# Patient Record
Sex: Male | Born: 1975
Health system: Southern US, Community
[De-identification: ages and names within clinical notes are randomized; demographics above are authoritative.]

## PROBLEM LIST (undated history)

## (undated) DIAGNOSIS — B349 Viral infection, unspecified: Secondary | ICD-10-CM

## (undated) DIAGNOSIS — A048 Other specified bacterial intestinal infections: Secondary | ICD-10-CM

## (undated) DIAGNOSIS — K219 Gastro-esophageal reflux disease without esophagitis: Secondary | ICD-10-CM

## (undated) DIAGNOSIS — R072 Precordial pain: Secondary | ICD-10-CM

## (undated) DIAGNOSIS — I77810 Thoracic aortic ectasia: Secondary | ICD-10-CM

## (undated) DIAGNOSIS — R079 Chest pain, unspecified: Secondary | ICD-10-CM

## (undated) DIAGNOSIS — I10 Essential (primary) hypertension: Secondary | ICD-10-CM

## (undated) DIAGNOSIS — L0291 Cutaneous abscess, unspecified: Secondary | ICD-10-CM

## (undated) DIAGNOSIS — Q2112 Patent foramen ovale: Secondary | ICD-10-CM

## (undated) DIAGNOSIS — R109 Unspecified abdominal pain: Secondary | ICD-10-CM

## (undated) DIAGNOSIS — R1013 Epigastric pain: Secondary | ICD-10-CM

## (undated) HISTORY — DX: Unspecified abdominal pain: R10.9

## (undated) HISTORY — DX: Thoracic aortic ectasia: I77.810

## (undated) HISTORY — DX: Precordial pain: R07.2

## (undated) HISTORY — DX: Cutaneous abscess, unspecified: L02.91

## (undated) HISTORY — DX: Epigastric pain: R10.13

## (undated) HISTORY — DX: Viral infection, unspecified: B34.9

## (undated) HISTORY — DX: Patent foramen ovale: Q21.12

## (undated) HISTORY — DX: Chest pain, unspecified: R07.9

## (undated) HISTORY — DX: Essential (primary) hypertension: I10

## (undated) HISTORY — DX: Other specified bacterial intestinal infections: A04.8

## (undated) HISTORY — DX: Gastro-esophageal reflux disease without esophagitis: K21.9

---

## 2017-01-19 ENCOUNTER — Encounter (HOSPITAL_COMMUNITY): Payer: Self-pay | Admitting: Family Medicine

## 2017-01-19 ENCOUNTER — Ambulatory Visit (HOSPITAL_COMMUNITY)
Admission: EM | Admit: 2017-01-19 | Discharge: 2017-01-19 | Disposition: A | Discharge status: Home / Self Care | Payer: BLUE CROSS/BLUE SHIELD | Attending: Family Medicine | Admitting: Family Medicine

## 2017-01-19 DIAGNOSIS — J069 Acute upper respiratory infection, unspecified: Secondary | ICD-10-CM | POA: Diagnosis not present

## 2017-01-19 DIAGNOSIS — K219 Gastro-esophageal reflux disease without esophagitis: Secondary | ICD-10-CM

## 2017-01-19 MED ORDER — RANITIDINE HCL 300 MG PO TABS: 300.0000 mg | ORAL_TABLET | Freq: Every day | ORAL | 2 refills | Status: DC

## 2017-01-19 MED ORDER — IPRATROPIUM BROMIDE 0.06 % NA SOLN: 2.0000 | Freq: Four times a day (QID) | NASAL | 0 refills | Status: DC

## 2017-01-19 NOTE — ED Provider Notes (Signed)
  [facility]   [id number] 01/19/17 Arrival Time: 1722  ASSESSMENT & PLAN:  1. Viral URI   2. Gastroesophageal reflux disease, esophagitis presence not specified     Meds ordered this encounter  Medications  . ranitidine (ZANTAC) 300 MG tablet    Sig: Take 1 tablet (300 mg total) by mouth at bedtime.    Dispense:  30 tablet    Refill:  2    Order Specific Question:   Supervising Provider    Answer:   Mardella LaymanHAGLER, BRIAN [id number]]  . ipratropium (ATROVENT) 0.06 % nasal spray    Sig: Place 2 sprays into both nostrils 4 (four) times daily.    Dispense:  15 mL    Refill:  0    Order Specific Question:   Supervising Provider    Answer:   Mardella LaymanHAGLER, BRIAN [[id number]]    Reviewed expectations re: course of current medical issues. Questions answered. Outlined signs and symptoms indicating need for more acute intervention. Patient verbalized understanding. After Visit Summary given.   SUBJECTIVE:  Martin Wright is a 41 y.o. male who presents with complaint of Sinus pain and pressure, along with congestion that is been ongoing for 1 week, states initially had fever, this is gone, he denies any cough, no ear pain or pressure, no ringing in his ears, no chest pain or shortness of breath, does have some burning in his abdomen, was going on prior to this illness. No dysuria or frequency, no nausea, vomiting, or constipation, he works in a warehouse doing manual labor, does not drink or smoke, no history of asthma or lung disease.  ROS: As per HPI, remainder of ROS negative.   OBJECTIVE:  Vitals:   01/19/17 1739  BP: (!) 135/93  Pulse: 70  Resp: 18  Temp: 98 F (36.7 C)  SpO2: 100%     General appearance: alert; no distress HEENT: normocephalic; atraumatic; conjunctivae normal; No submental, submandibular, tonsillar, pre-or postauricular lymphadenopathy, oropharynx is clear without erythema or edema, there is no tonsillar exudate, no epistaxis, turbinates are boggy, no  sinus tenderness. Neck: No cervical lymphadenopathy Lungs: clear to auscultation bilaterally Heart: regular rate and rhythm Abdomen: soft, non-tender; bowel sounds normal; no masses or organomegaly; no guarding or rebound tenderness Back: no CVA tenderness Extremities: no cyanosis or edema; symmetrical with no gross deformities Skin: warm and dry Neurologic: Grossly normal Psychological:  alert and cooperative; normal mood and affect  Procedures:     No results found for this or any previous visit.  Labs Reviewed - No data to display  No results found.  No Known Allergies  PMHx, SurgHx, SocialHx, Medications, and Allergies were reviewed in the Visit Navigator and updated as appropriate.       Dorena BodoKennard, Marisella Puccio, NP 01/19/17 [phone]

## 2017-01-19 NOTE — Discharge Instructions (Signed)
You most likely have a viral URI, this type of infection will not be helped by antibiotics.  For your symptoms I have prescribed ipratropium for congestion and runny nose, 2 sprays each nostril up to 4 times a day, and for your reflux, Zantac, one tablet every night at bedtime.   In addition to these therapies, I advise rest, plenty of fluids and management of symptoms with over the counter medicines. Over the counter therapies for your symptoms include:Tylenol as needed every 4-6 hours for body aches or fever, not to exceed 4,000 mg a day, Take mucinex or mucinex DM ever 12 hours with a full glass of water, you may use an inhaled steroid such as Flonase, 2 sprays each nostril once a day for congestion, or an antihistamine such as Claritin or Zyrtec once a day. Another alternative for congestion, is a pseudoephedrine containing product available from the pharmacist. Should your symptoms worsen or fail to resolve, follow up with your primary care provider or return to clinic.

## 2017-01-19 NOTE — ED Triage Notes (Signed)
Pt here for cough, congestion, runny nose, body aches.

## 2017-04-16 ENCOUNTER — Ambulatory Visit (INDEPENDENT_AMBULATORY_CARE_PROVIDER_SITE_OTHER): Payer: BLUE CROSS/BLUE SHIELD

## 2017-04-16 ENCOUNTER — Encounter (HOSPITAL_COMMUNITY): Payer: Self-pay | Admitting: Emergency Medicine

## 2017-04-16 ENCOUNTER — Ambulatory Visit (HOSPITAL_COMMUNITY)
Admission: EM | Admit: 2017-04-16 | Discharge: 2017-04-16 | Disposition: A | Discharge status: Home / Self Care | Payer: BLUE CROSS/BLUE SHIELD | Attending: Internal Medicine | Admitting: Internal Medicine

## 2017-04-16 DIAGNOSIS — R1084 Generalized abdominal pain: Secondary | ICD-10-CM

## 2017-04-16 MED ORDER — OMEPRAZOLE 20 MG PO CPDR: 20.0000 mg | DELAYED_RELEASE_CAPSULE | Freq: Every day | ORAL | 0 refills | Status: DC

## 2017-04-16 NOTE — ED Provider Notes (Signed)
[facility]    CSN: [id number] Arrival date & time: 04/16/17  1658     History   Chief Complaint Chief Complaint  Patient presents with  . Abdominal Pain    HPI Martin Wright is a 41 y.o. male.   Patient does not have PCP.     The history is provided by the patient.  Abdominal Pain  Pain location:  Generalized Pain quality: burning   Pain radiation: occasionally radiates to the chest. Pain severity:  Unable to specify Onset quality:  Gradual Duration: Chronic; over 1 year. Timing:  Intermittent Progression:  Unchanged Chronicity:  Chronic Context: not alcohol use, not awakening from sleep, not diet changes, not recent travel, not sick contacts and not suspicious food intake   Relieved by:  Nothing Worsened by:  Nothing Ineffective treatments:  None tried Associated symptoms: constipation   Associated symptoms: no anorexia, no belching, no chest pain, no diarrhea, no dysuria, no nausea and no shortness of breath     History reviewed. No pertinent past medical history.  There are no active problems to display for this patient.   History reviewed. No pertinent surgical history.     Home Medications    Prior to Admission medications   Medication Sig Start Date End Date Taking? Authorizing Provider  ipratropium (ATROVENT) 0.06 % nasal spray Place 2 sprays into both nostrils 4 (four) times daily. 01/19/17   Dorena Bodo, NP  omeprazole (PRILOSEC) 20 MG capsule Take 1 capsule (20 mg total) daily by mouth. 04/16/17 05/16/17  Lucia Estelle, NP  ranitidine (ZANTAC) 300 MG tablet Take 1 tablet (300 mg total) by mouth at bedtime. 01/19/17   Dorena Bodo, NP    Family History History reviewed. No pertinent family history.  Social History Social History   Tobacco Use  . Smoking status: Not on file  Substance Use Topics  . Alcohol use: Not on file  . Drug use: Not on file     Allergies   Patient has no known allergies.   Review of  Systems Review of Systems  Constitutional:       See HPI  Respiratory: Negative for shortness of breath.   Cardiovascular: Negative for chest pain.  Gastrointestinal: Positive for abdominal pain and constipation. Negative for anorexia, diarrhea and nausea.  Genitourinary: Negative for dysuria.     Physical Exam Triage Vital Signs ED Triage Vitals [04/16/17 1717]  Enc Vitals Group     BP (!) 146/92     Pulse Rate (!) 59     Resp 18     Temp 98.3 F (36.8 C)     Temp Source Oral     SpO2 99 %     Weight      Height      Head Circumference      Peak Flow      Pain Score      Pain Loc      Pain Edu?      Excl. in GC?    No data found.  Updated Vital Signs BP (!) 146/92 (BP Location: Right Arm)   Pulse (!) 59   Temp 98.3 F (36.8 C) (Oral)   Resp 18   SpO2 99%   Visual Acuity Right Eye Distance:   Left Eye Distance:   Bilateral Distance:    Right Eye Near:   Left Eye Near:    Bilateral Near:     Physical Exam  Constitutional: He appears well-developed and well-nourished. No distress.  HENT:  Head: Normocephalic and atraumatic.  Cardiovascular: Normal rate and normal heart sounds.  No murmur heard. Pulmonary/Chest: Effort normal and breath sounds normal. He has no wheezes.  Abdominal: Soft. Normal appearance and bowel sounds are normal. He exhibits no distension and no mass. There is no tenderness.  Skin: Skin is warm and dry.  Nursing note and vitals reviewed.   UC Treatments / Results  Labs (all labs ordered are listed, but only abnormal results are displayed) Labs Reviewed - No data to display  EKG  EKG Interpretation None       Radiology Dg Abd 2 Views  Result Date: 04/16/2017 CLINICAL DATA:  Abdominal pain.  Symptoms for 2 days. EXAM: ABDOMEN - 2 VIEW COMPARISON:  None. FINDINGS: Nonobstructive gas pattern. Moderate stool burden. Curvilinear calcification, subcentimeter, RIGHT mid abdomen, which appears to move with the bowel, not  retroperitoneal in location. This could represent a small lymph node or even an appendicolith. No definite renal calculi. Osseous structures unremarkable. IMPRESSION: Nonobstructive gas pattern. Subcentimeter curvilinear calcification on the RIGHT of uncertain significance. Consider CT abdomen and pelvis with contrast for further evaluation. Electronically Signed   By: Elsie StainJohn T Curnes M.D.   On: 04/16/2017 19:54    Procedures Procedures (including critical care time)  Medications Ordered in UC Medications - No data to display   Initial Impression / Assessment and Plan / UC Course  I have reviewed the triage vital signs and the nursing notes.  Pertinent labs & imaging results that were available during my care of the patient were reviewed by me and considered in my medical decision making (see chart for details).   Final Clinical Impressions(s) / UC Diagnoses   Final diagnoses:  Generalized abdominal pain   Healthy 41 y.o male, present with chronic intermittent burning abdominal pain of unclear etiology. Abd xray shows subcentimeter curvilinear calcification on the right abdomen of uncertain significance. However doubt that this finding is the cause of his abdominal pain. Will trial course of omeprazole for his burning pain.    Patient informed of the xray result and instructed to establish care with a  PCP and f/u accordingly.   [facility] Also Instructed to have his BP recheck during follow up.    ED Discharge Orders        Ordered    omeprazole (PRILOSEC) 20 MG capsule  Daily     04/16/17 2008       Controlled Substance Prescriptions [place] Controlled Substance Registry consulted? Not Applicable   Lucia EstelleZheng, Othell Diluzio, NP 04/16/17 2016

## 2017-04-16 NOTE — ED Triage Notes (Signed)
Pt sts generalized abd pain into back x 2 days

## 2017-05-07 ENCOUNTER — Ambulatory Visit (INDEPENDENT_AMBULATORY_CARE_PROVIDER_SITE_OTHER): Payer: BLUE CROSS/BLUE SHIELD | Admitting: Physician Assistant

## 2017-05-07 ENCOUNTER — Encounter (INDEPENDENT_AMBULATORY_CARE_PROVIDER_SITE_OTHER): Payer: Self-pay | Admitting: Physician Assistant

## 2017-05-07 VITALS — BP 135/89 | HR 70 | Temp 98.1°F | Ht 64.5 in | Wt 120.4 lb

## 2017-05-07 DIAGNOSIS — R1013 Epigastric pain: Secondary | ICD-10-CM

## 2017-05-07 DIAGNOSIS — R109 Unspecified abdominal pain: Secondary | ICD-10-CM | POA: Diagnosis not present

## 2017-05-07 DIAGNOSIS — R933 Abnormal findings on diagnostic imaging of other parts of digestive tract: Secondary | ICD-10-CM

## 2017-05-07 DIAGNOSIS — R630 Anorexia: Secondary | ICD-10-CM | POA: Diagnosis not present

## 2017-05-07 DIAGNOSIS — R197 Diarrhea, unspecified: Secondary | ICD-10-CM

## 2017-05-07 DIAGNOSIS — R42 Dizziness and giddiness: Secondary | ICD-10-CM | POA: Diagnosis not present

## 2017-05-07 DIAGNOSIS — Z8619 Personal history of other infectious and parasitic diseases: Secondary | ICD-10-CM | POA: Diagnosis not present

## 2017-05-07 LAB — POCT URINALYSIS DIPSTICK
Glucose, UA: NEGATIVE
LEUKOCYTES UA: NEGATIVE
Nitrite, UA: NEGATIVE
Protein, UA: 30
SPEC GRAV UA: 1.02 (ref 1.010–1.025)
Urobilinogen, UA: 0.2 E.U./dL
pH, UA: 5.5 (ref 5.0–8.0)

## 2017-05-07 MED ORDER — MECLIZINE HCL 25 MG PO TABS: 25.0000 mg | ORAL_TABLET | Freq: Three times a day (TID) | ORAL | 0 refills | Status: DC | PRN

## 2017-05-07 MED ORDER — LOPERAMIDE HCL 2 MG PO TABS: 2.0000 mg | ORAL_TABLET | Freq: Four times a day (QID) | ORAL | 0 refills | Status: DC | PRN

## 2017-05-07 MED ORDER — OMEPRAZOLE 40 MG PO CPDR: 40.0000 mg | DELAYED_RELEASE_CAPSULE | Freq: Every day | ORAL | 3 refills | Status: DC

## 2017-05-07 NOTE — Progress Notes (Signed)
Pt states that three weeks ago he had a burning sensation in his stomach, this burning was present even while he was back [place], he would take antibiotics and the burning would go away. Pt was seen in Urgent Care and had an Xray done, he was told that they found something in his stomach but couldn't tell him what, he was told he needed further imagining to determine exactly what is in his stomach. Pt was advised to take omeprazole he bought it and began taking it, he was then told by someone to drink carnation milk to help with the burning, he did that. Now he states that he has had blurred vision and dizziness.   Pt states in 2012 he was diagnosed with Hep B  Pt states that he sometimes feels weakness in his legs and joints   Pt states he has a poor appetite   Pt states on Sunday he ate plantains and potato salad and has since been passing watery stool

## 2017-05-07 NOTE — Progress Notes (Signed)
Subjective:  Patient ID: Martin Wright, male    DOB: [birth year]  Age: 41 y.o. MRN: [id number]  CC:  Epigastric pain    HPI Martin Wright is a 41 y.o. male with no signifcant medical history presents with epigastric pain. Was preciously prescribed antibiotics and omeprazole which helped with "burning" sensation. Pain is also felt in the right and left flanks. Had recent imaging which incidentally revealed a "calcification" in the mid right abdomen but could not be differentiated. CT abdomen/pelvis with contrast advised. Has been having watery non bloody diarrhea since eating potato salad. Has a poor appetite but admittingly has had poor appetite for many years since being in [place]. He was diagnosed with hepatitis B in [place] in 2012. Complains of "dizziness in the eyes".          Outpatient Medications Prior to Visit  Medication Sig Dispense Refill  . omeprazole (PRILOSEC) 20 MG capsule Take 1 capsule (20 mg total) daily by mouth. 30 capsule 0  . ipratropium (ATROVENT) 0.06 % nasal spray Place 2 sprays into both nostrils 4 (four) times daily. (Patient not taking: Reported on 05/07/2017) 15 mL 0  . ranitidine (ZANTAC) 300 MG tablet Take 1 tablet (300 mg total) by mouth at bedtime. (Patient not taking: Reported on 05/07/2017) 30 tablet 2   No facility-administered medications prior to visit.      ROS Review of Systems  Constitutional: Negative for chills, fever and malaise/fatigue.  Eyes: Negative for blurred vision.  Respiratory: Negative for shortness of breath.   Cardiovascular: Negative for chest pain and palpitations.  Gastrointestinal: Negative for abdominal pain and nausea.  Genitourinary: Negative for dysuria and hematuria.  Musculoskeletal: Negative for joint pain and myalgias.  Skin: Negative for rash.  Neurological: Negative for tingling and headaches.  Psychiatric/Behavioral: Negative for depression. The patient is not nervous/anxious.     Objective:   BP 135/89 (BP Location: Left Arm, Patient Position: Sitting, Cuff Size: Normal)   Pulse 70   Temp 98.1 F (36.7 C) (Oral)   Ht 5' 4.5" (1.638 m)   Wt 120 lb 6.4 oz (54.6 kg)   SpO2 98%   BMI 20.35 kg/m   BP/Weight 05/07/2017 04/16/2017 01/19/2017  Systolic BP 135 146 135  Diastolic BP 89 92 93  Wt. (Lbs) 120.4 - -  BMI 20.35 - -      Physical Exam  Constitutional: He is oriented to person, place, and time.  Well developed, thin, NAD, polite  HENT:  Head: Normocephalic and atraumatic.  Eyes: No scleral icterus.  Neck: Normal range of motion. Neck supple. No thyromegaly present.  Cardiovascular: Normal rate, regular rhythm and normal heart sounds.  Pulmonary/Chest: Effort normal and breath sounds normal.  Abdominal: Soft. Bowel sounds are normal. He exhibits no distension and no mass. There is no tenderness. There is no rebound and no guarding.  No hepatomegaly  Musculoskeletal: He exhibits no edema.  Neurological: He is alert and oriented to person, place, and time. No cranial nerve deficit. Coordination normal.  Skin: Skin is warm and dry. No rash noted. No erythema. No pallor.  Psychiatric: He has a normal mood and affect. His behavior is normal. Thought content normal.  Vitals reviewed.    Assessment & Plan:    1. Abdominal pain, epigastric - H. pylori breath test - Begin omeprazole (PRILOSEC) 40 MG capsule; Take 1 capsule (40 mg total) by mouth daily.  Dispense: 30 capsule; Refill: 3  2. Bilateral flank pain - Urinalysis Dipstick with  trace ketones and trace blood in clinic today.  3. Diarrhea, unspecified type - Ova and Parasite Examination - Stool culture - CBC with Differential - Comprehensive metabolic panel  4. Anorexia - CBC with Differential - Comprehensive metabolic panel - TSH  5. History of hepatitis B virus infection - Hepatitis panel, acute  6. Abnormal findings on diagnostic imaging of other parts of digestive tract - CT Abdomen Pelvis W  Contrast; Future   Meds ordered this encounter  Medications  . loperamide (IMODIUM A-D) 2 MG tablet    Sig: Take 1 tablet (2 mg total) by mouth 4 (four) times daily as needed for diarrhea or loose stools.    Dispense:  30 tablet    Refill:  0    Order Specific Question:   Supervising Provider    Answer:   Quentin AngstJEGEDE, OLUGBEMIGA E [id number]  . omeprazole (PRILOSEC) 40 MG capsule    Sig: Take 1 capsule (40 mg total) by mouth daily.    Dispense:  30 capsule    Refill:  3    Order Specific Question:   Supervising Provider    Answer:   Quentin AngstJEGEDE, OLUGBEMIGA E [id number]    Follow-up: Return in about 4 weeks (around 06/04/2017) for abdominal discomfort.   Loletta Specteroger David Konner Warrior PA

## 2017-05-07 NOTE — Patient Instructions (Signed)
Hepatitis B Antigen and Antibody Tests Why am I having this test? Testing for hepatitis B virus (HBV) can include checking your blood for infection by the virus (antigen) itself or for hepatitis B antibodies. Hepatitis B antibodies are infection-fighting proteins produced by your body's defense (immune) system in response to an infection with HBV. These antibodies are also produced after you have received the hepatitis B vaccine. Different types of HBV antibodies may be present in your blood, depending on how long it has been since infection or vaccination occurred. Different types of HBV antigens can also be detected. The hepatitis B antigen and antibody tests detect the presence of these antigens or antibodies. These tests may be done:  If there is concern that you have been exposed to hepatitis B.  To diagnose hepatitis B infection.  If you have long-term (chronic) liver disease or abnormal liver function test results. This test may help to find the cause.  To see if you are already protected from (immune to) hepatitis B.  To see if you need the hepatitis B vaccine to protect you from future infection.  What kind of sample is taken? A blood sample is required for the hepatitis B antigen and antibody tests. It is usually collected by inserting a needle into a vein. How do I prepare for this test? There is no preparation required for this test. What do the results mean? It is your responsibility to obtain your test results. Ask the lab or department performing the test when and how you will get your results. Talk with your health care provider if you have any questions about your results. The test results are based on which antibodies your blood has (is positive for) and which antibodies your blood does not have (is negative for). The test results are also based on whether hepatitis B antigen molecules are found in your blood. Normal Test Results The test result is normal if it matches one  of these descriptions:  Negative HBsAg and HBeAg. This means no HBV antigen is present in your blood.  Positive HBsAb. This means you do not have an active infection but that you are protected from HBV from past exposure or from vaccination.  Abnormal Test Results The test result is abnormal and may indicate current or recent HBV infection if it matches one of these descriptions:  Positive HBeAg and HBsAg. This means you have a current HBV infection.  Positive HBVc-Ab/IgM or HBVc-Ab total. This means you have a current, previous, or long-term HBV infection.  Discuss your test results with your health care provider. He or she will use the results to make a diagnosis and determine a treatment plan that is right for you. Talk with your health care provider to discuss your results, treatment options, and if necessary, the need for more tests. Talk with your health care provider if you have any questions about your results. This information is not intended to replace advice given to you by your health care provider. Make sure you discuss any questions you have with your health care provider. Document Released: 06/22/2004 Document Revised: 02/01/2016 Document Reviewed: 10/13/2013 Elsevier Interactive Patient Education  2018 [employer]  

## 2017-05-08 LAB — COMPREHENSIVE METABOLIC PANEL
ALBUMIN: 5.3 g/dL (ref 3.5–5.5)
ALT: 23 IU/L (ref 0–44)
AST: 21 IU/L (ref 0–40)
Albumin/Globulin Ratio: 1.8 (ref 1.2–2.2)
Alkaline Phosphatase: 58 IU/L (ref 39–117)
BUN/Creatinine Ratio: 12 (ref 9–20)
BUN: 15 mg/dL (ref 6–24)
Bilirubin Total: 1.1 mg/dL (ref 0.0–1.2)
CO2: 23 mmol/L (ref 20–29)
Calcium: 10.3 mg/dL — ABNORMAL HIGH (ref 8.7–10.2)
Chloride: 103 mmol/L (ref 96–106)
Creatinine, Ser: 1.25 mg/dL (ref 0.76–1.27)
GFR calc Af Amer: 82 mL/min/{1.73_m2} (ref >59)
GFR calc non Af Amer: 71 mL/min/{1.73_m2} (ref >59)
GLUCOSE: 79 mg/dL (ref 65–99)
Globulin, Total: 3 g/dL (ref 1.5–4.5)
POTASSIUM: 4.9 mmol/L (ref 3.5–5.2)
SODIUM: 139 mmol/L (ref 134–144)
TOTAL PROTEIN: 8.3 g/dL (ref 6.0–8.5)

## 2017-05-08 LAB — CBC WITH DIFFERENTIAL/PLATELET
BASOS ABS: 0 10*3/uL (ref 0.0–0.2)
Basos: 0 %
EOS (ABSOLUTE): 0.9 10*3/uL — AB (ref 0.0–0.4)
Eos: 14 %
HEMOGLOBIN: 16.9 g/dL (ref 13.0–17.7)
Hematocrit: 49.3 % (ref 37.5–51.0)
IMMATURE GRANS (ABS): 0 10*3/uL (ref 0.0–0.1)
IMMATURE GRANULOCYTES: 0 %
LYMPHS: 32 %
Lymphocytes Absolute: 2.2 10*3/uL (ref 0.7–3.1)
MCH: 29.6 pg (ref 26.6–33.0)
MCHC: 34.3 g/dL (ref 31.5–35.7)
MCV: 87 fL (ref 79–97)
MONOCYTES: 9 %
Monocytes Absolute: 0.6 10*3/uL (ref 0.1–0.9)
NEUTROS ABS: 3.1 10*3/uL (ref 1.4–7.0)
NEUTROS PCT: 45 %
PLATELETS: 214 10*3/uL (ref 150–379)
RBC: 5.7 x10E6/uL (ref 4.14–5.80)
RDW: 12.7 % (ref 12.3–15.4)
WBC: 6.9 10*3/uL (ref 3.4–10.8)

## 2017-05-08 LAB — HEPATITIS PANEL, ACUTE
HEP A IGM: NEGATIVE
Hep B C IgM: NEGATIVE
Hep C Virus Ab: <0.1 s/co ratio (ref 0.0–0.9)
Hepatitis B Surface Ag: POSITIVE — AB

## 2017-05-08 LAB — TSH: TSH: 3.44 u[IU]/mL (ref 0.450–4.500)

## 2017-05-08 LAB — H. PYLORI BREATH TEST: H PYLORI BREATH TEST: POSITIVE — AB

## 2017-05-14 ENCOUNTER — Ambulatory Visit (HOSPITAL_COMMUNITY): Admission: RE | Admit: 2017-05-14 | Payer: BLUE CROSS/BLUE SHIELD | Source: Ambulatory Visit

## 2017-05-21 ENCOUNTER — Ambulatory Visit (HOSPITAL_COMMUNITY)
Admission: RE | Admit: 2017-05-21 | Discharge: 2017-05-21 | Disposition: A | Discharge status: Home / Self Care | Payer: BLUE CROSS/BLUE SHIELD | Source: Ambulatory Visit | Attending: Physician Assistant | Admitting: Physician Assistant

## 2017-05-21 DIAGNOSIS — R933 Abnormal findings on diagnostic imaging of other parts of digestive tract: Secondary | ICD-10-CM | POA: Insufficient documentation

## 2017-05-21 MED ORDER — IOPAMIDOL (ISOVUE-300) INJECTION 61%
INTRAVENOUS | Status: AC
  Administered 2017-05-21: 100 mL
  Filled 2017-05-21: qty 100

## 2017-05-23 ENCOUNTER — Telehealth (INDEPENDENT_AMBULATORY_CARE_PROVIDER_SITE_OTHER): Payer: Self-pay

## 2017-05-23 NOTE — Telephone Encounter (Signed)
-----   Message from Loletta Specteroger David Gomez, PA-C sent at 05/23/2017 11:25 AM EST ----- No abnormalities found.

## 2017-05-23 NOTE — Telephone Encounter (Signed)
Left patient a message informing that no abnormalities were found on the CT, and to call the office with any questions. Maryjean Mornempestt S Roberts, CMA

## 2017-06-05 ENCOUNTER — Ambulatory Visit (INDEPENDENT_AMBULATORY_CARE_PROVIDER_SITE_OTHER): Payer: BLUE CROSS/BLUE SHIELD | Admitting: Physician Assistant

## 2017-06-05 ENCOUNTER — Encounter (INDEPENDENT_AMBULATORY_CARE_PROVIDER_SITE_OTHER): Payer: Self-pay | Admitting: Physician Assistant

## 2017-06-05 VITALS — BP 147/89 | HR 78 | Temp 98.7°F | Resp 18 | Ht 65.35 in | Wt 128.0 lb

## 2017-06-05 DIAGNOSIS — B1910 Unspecified viral hepatitis B without hepatic coma: Secondary | ICD-10-CM | POA: Diagnosis not present

## 2017-06-05 DIAGNOSIS — Z23 Encounter for immunization: Secondary | ICD-10-CM

## 2017-06-05 DIAGNOSIS — R630 Anorexia: Secondary | ICD-10-CM

## 2017-06-05 DIAGNOSIS — A048 Other specified bacterial intestinal infections: Secondary | ICD-10-CM | POA: Diagnosis not present

## 2017-06-05 DIAGNOSIS — Z114 Encounter for screening for human immunodeficiency virus [HIV]: Secondary | ICD-10-CM

## 2017-06-05 MED ORDER — CLARITHROMYCIN 500 MG PO TABS: 500.0000 mg | ORAL_TABLET | Freq: Two times a day (BID) | ORAL | 0 refills | Status: AC

## 2017-06-05 MED ORDER — DRONABINOL 2.5 MG PO CAPS: 2.5000 mg | ORAL_CAPSULE | Freq: Two times a day (BID) | ORAL | 0 refills | Status: DC

## 2017-06-05 MED ORDER — OMEPRAZOLE 40 MG PO CPDR: 40.0000 mg | DELAYED_RELEASE_CAPSULE | Freq: Every day | ORAL | 0 refills | Status: DC

## 2017-06-05 MED ORDER — AMOXICILLIN 500 MG PO TABS: 1000.0000 mg | ORAL_TABLET | Freq: Two times a day (BID) | ORAL | 0 refills | Status: AC

## 2017-06-05 NOTE — Progress Notes (Signed)
Subjective:  Patient ID: Martin Wright, male    DOB: October 07, 1975  Age: 41 y.o. MRN: [id number]  CC: epigastric pain  HPI  Martin Sallesbraham Kpah Herst is a 41 y.o. male with no signifcant medical history presents for f/u epigastric pain. Was preciously prescribed antibiotics and omeprazole which helped with "burning" sensation. Pain is also felt in the right and left flanks. Had recent imaging which incidentally revealed a "calcification" in the mid right abdomen but could not be differentiated. CT abdomen/pelvis with contrast advised. Has a poor appetite but admittingly has had poor appetite for many years since being in [place]. He was diagnosed with hepatitis B in [place] in [place]. He has been taking omeprazole 40 mg as directed which has not provided sufficient relief. H pylori breath testing one month ago was positive. Was not aware until now. CT abdomen and pelvis was normal. Does not endorse any symptoms or complaints.    Outpatient Medications Prior to Visit  Medication Sig Dispense Refill  . ipratropium (ATROVENT) 0.06 % nasal spray Place 2 sprays into both nostrils 4 (four) times daily. (Patient not taking: Reported on 05/07/2017) 15 mL 0  . loperamide (IMODIUM A-D) 2 MG tablet Take 1 tablet (2 mg total) by mouth 4 (four) times daily as needed for diarrhea or loose stools. 30 tablet 0  . meclizine (ANTIVERT) 25 MG tablet Take 1 tablet (25 mg total) by mouth 3 (three) times daily as needed for dizziness. 30 tablet 0  . omeprazole (PRILOSEC) 40 MG capsule Take 1 capsule (40 mg total) by mouth daily. 30 capsule 3  . ranitidine (ZANTAC) 300 MG tablet Take 1 tablet (300 mg total) by mouth at bedtime. (Patient not taking: Reported on 05/07/2017) 30 tablet 2   No facility-administered medications prior to visit.      ROS Review of Systems  Constitutional: Negative for chills, fever and malaise/fatigue.  Eyes: Negative for blurred vision.  Respiratory: Negative for shortness of breath.    Cardiovascular: Negative for chest pain and palpitations.  Gastrointestinal: Positive for abdominal pain and heartburn. Negative for blood in stool, constipation, diarrhea, melena, nausea and vomiting.  Genitourinary: Negative for dysuria and hematuria.  Musculoskeletal: Negative for joint pain and myalgias.  Skin: Negative for rash.  Neurological: Negative for tingling and headaches.  Psychiatric/Behavioral: Negative for depression. The patient is not nervous/anxious.     Objective:  There were no vitals taken for this visit.  BP/Weight 05/07/2017 04/16/2017 01/19/2017  Systolic BP 135 146 135  Diastolic BP 89 92 93  Wt. (Lbs) 120.4 - -  BMI 20.35 - -      Physical Exam  Constitutional: He is oriented to person, place, and time.  Well developed, well nourished, NAD, polite  HENT:  Head: Normocephalic and atraumatic.  Eyes: No scleral icterus.  Neck: Normal range of motion. Neck supple. No thyromegaly present.  Cardiovascular: Normal rate, regular rhythm and normal heart sounds.  Pulmonary/Chest: Effort normal and breath sounds normal.  Abdominal: Soft. Bowel sounds are normal. There is no tenderness.  Musculoskeletal: He exhibits no edema.  Neurological: He is alert and oriented to person, place, and time.  Skin: Skin is warm and dry. No rash noted. No erythema. No pallor.  Psychiatric: He has a normal mood and affect. His behavior is normal. Thought content normal.  Vitals reviewed.    Assessment & Plan:    1. H. pylori infection - clarithromycin (BIAXIN) 500 MG tablet; Take 1 tablet (500 mg total) by mouth 2 (  two) times daily for 14 days.  Dispense: 28 tablet; Refill: 0 - amoxicillin (AMOXIL) 500 MG tablet; Take 2 tablets (1,000 mg total) by mouth 2 (two) times daily for 14 days.  Dispense: 56 tablet; Refill: 0 - omeprazole (PRILOSEC) 40 MG capsule; Take 1 capsule (40 mg total) by mouth daily.  Dispense: 28 capsule; Refill: 0  2. Hepatitis B infection without delta  agent without hepatic coma, unspecified chronicity - Hepatitis B Virus (Profile VI)  3. Encounter for screening for HIV - HIV antibody  4. Need for Tdap vaccination - Tdap vaccine greater than or equal to 7yo IM  5. Need for prophylactic vaccination and inoculation against influenza - Flu Vaccine QUAD 6+ mos PF IM (Fluarix Quad PF)  6. Poor appetite - dronabinol (MARINOL) 2.5 MG capsule; Take 1 capsule (2.5 mg total) by mouth 2 (two) times daily before a meal.  Dispense: 60 capsule; Refill: 0   Meds ordered this encounter  Medications  . clarithromycin (BIAXIN) 500 MG tablet    Sig: Take 1 tablet (500 mg total) by mouth 2 (two) times daily for 14 days.    Dispense:  28 tablet    Refill:  0    Order Specific Question:   Supervising Provider    Answer:   Quentin AngstJEGEDE, OLUGBEMIGA E [id number]]  . amoxicillin (AMOXIL) 500 MG tablet    Sig: Take 2 tablets (1,000 mg total) by mouth 2 (two) times daily for 14 days.    Dispense:  56 tablet    Refill:  0    Order Specific Question:   Supervising Provider    Answer:   Quentin AngstJEGEDE, OLUGBEMIGA E [id number]]  . omeprazole (PRILOSEC) 40 MG capsule    Sig: Take 1 capsule (40 mg total) by mouth daily.    Dispense:  28 capsule    Refill:  0    Order Specific Question:   Supervising Provider    Answer:   Quentin AngstJEGEDE, OLUGBEMIGA E [id number]]  . dronabinol (MARINOL) 2.5 MG capsule    Sig: Take 1 capsule (2.5 mg total) by mouth 2 (two) times daily before a meal.    Dispense:  60 capsule    Refill:  0    Order Specific Question:   Supervising Provider    Answer:   Quentin AngstJEGEDE, OLUGBEMIGA E [[id number]][[id number]]    Follow-up: Return in about 6 weeks (around 07/17/2017) for for hepatitis and h pylori infection.   Loletta Specteroger David Gomez PA

## 2017-06-05 NOTE — Progress Notes (Signed)
Patient was informed in office and concerns were addressed.

## 2017-06-05 NOTE — Patient Instructions (Signed)
Community Resources  Advocacy/Legal Legal Aid [place]:  1-866-219-5262  /  336-272-0148  Family Justice Center:  336-641-7233  [facility] [place] 24-hr Crisis line:  336-273-7273  Women's Resource Center, [place]:  336-275-6090  Court Watch (custody):  336-275-2346  [place] [facility]:   336-279-9299    Baby & Breastfeeding Car Seat Inspection @ Various [place] Fire Depts.- call 336-373-2177  Branch Lactation  336-832-6860  [facility] 336-878-6712  WIC: 336-641-3663 ([place]);  336-641-7571 ([place])  La Leche League:  1-877-452-5321   Childcare [place] [facility]: 336-369-5097 ([place]) / 336-887-8224 ([place])  - Child Care Resources/ Referrals/ Scholarships  - Head Start/ Early Head Start (call or apply online)  [place] DHHS: [place] Pre[facility] :  1-800-859-0829 / 336-274-5437   Employment / Job Search [facility] [place]: 336-275-6090 / [address]  [place] [employer] (JobLink): 336-373-5922 ([place]) / 336-882-4141 ([place])  [facility] Community Resource/ Career Center: 336-275-9801 / 336-282-7307  [facility] Job & Career Center: 336-373-3764  DHHS Work First: 336-641-3447 ([place]) / 336-641-3447 ([place])  [facility] [place]:  336-676-5871   Financial Assistance [facility]:  336-553-2657  [employer]: 336-235-0368  [employer] (furniture):  336-370-4002  [place] [facility]: 336-373-4264  Low Income Energy Assistance  336-641-3000   Food Assistance DHHS- SNAP/ Food Stamps: 336-641-4588  WIC: [place]- 336-641-3663 ;  [place] 336-641-7571  Little Green Book- Free Meals  Little Blue Book- Free Food Pantries  During the summer, text "FOOD" to 877877   General Health / Clinics (Adults) Orange Card (for Adults) through [facility]: (336) 895-4900  [facility]:   336-832-8035  [facility]:   336-832-4444  Health Department:  336-641-3245  [facility]:  336-415-3877 / 336-641-2100  [facility] [place]:   336-373-0678  [facility]:   336-334-4822 x 50251   Housing [place] [facility]:   336-691-9521  [place] Housing Authority:  336-275-8501  Affordable Housing Managemnt:  336-273-0568   Immigrant/ Refugee Center for [place]ians (UNCG):  336-256-1065  Faith [facility]:  336-379-0037  [employer]:  336-937-4701  Church World Services:  336-617-0381  African Services Coalition:  336-574-2677   LGBTQ YouthSAFE  www.youthsafe[place].org  PFLAG  336-541-6754 / info@[place].org  The Trevor Project:  1-866-488-7386   Mental Health/ Substance Use [facility]  336-387-6161  [facility]:  336-832-9700 or 1-800-711-2635  [facility] Circle of Care:  336-271-5888  [facility]:  336-294-1349  [facility]:  336-542-2884  [facility] (walk-ins)  336-676-6840 / [address]  [place]:  800-449-1287  Alcoholics Anonymous:  336-854-4278  Narcotics Anonymous:  800-365-1036  Quit Smoking Hotline:  800-QUIT-NOW (800-784-8669)   Parenting Children's Home Society:  800-632-1400  [place]: Education Center & Support Groups:  336-832-6682  YWCA: 336-273-3461  [employer]: Bringing Out the Best:  336-334-3120               Thriving at Three (Hispanic families): 336-256-1066  Healthy Start ([facility]):  336-387-6161 x2288  Parents as Teachers:  336-691-0024  [place] [facility]- Learning Together (Immigrants): 336-369-5001   Poison Control 800-222-1222  Sports & Recreation [employer] Open Doors Application: ymcanwnc.org/join/open-doors-financial-assistance/  City of [place] Recreation Centers: http://www.[place].gov/index.aspx?page=3615   Special Needs Family Support Network:  336-832-6507  Autism Society of [place]:   336-333-0197 x1402 or x1412 /  800-785-1035  TEACCH :  336-334-5773     ARC of [place]:  336-373-1076  Children's Developmental Service Agency (CDSA):  336-334-5601  CC4C (Care Coordination for Children):  336-641-7641   Transportation Medicaid Transportation: 336-641-4848 to apply  [place] Transit Authority: 336-335-6499 (reduced-fare bus ID to Medicaid/ Medicare/ Orange Card)  SCAT Paratransit services: Eligible riders only, call 336-333-6589 for application   Tutoring/Mentoring Black Child Development Institute: 336-230-2138  Big Brothers/ Big Sisters: 336-378-9100 ([place])  336-882-4167 ([place])  ACES through child's school: 336-370-2321  [employer] Achievers: contact your local Y  SHIELD Mentor Program: 336-337-2771   

## 2017-06-06 ENCOUNTER — Other Ambulatory Visit (INDEPENDENT_AMBULATORY_CARE_PROVIDER_SITE_OTHER): Payer: Self-pay | Admitting: Physician Assistant

## 2017-06-06 ENCOUNTER — Telehealth (INDEPENDENT_AMBULATORY_CARE_PROVIDER_SITE_OTHER): Payer: Self-pay

## 2017-06-06 DIAGNOSIS — B181 Chronic viral hepatitis B without delta-agent: Secondary | ICD-10-CM

## 2017-06-06 NOTE — Telephone Encounter (Signed)
-----   Message from Loletta Specteroger David Gomez, PA-C sent at 06/06/2017  8:45 AM EST ----- HIV negative. Seems he is a chronic carrier of Hepatitis B. I will send him to ID.

## 2017-06-06 NOTE — Telephone Encounter (Signed)
Patients phone rang several times before disconnecting, will attempt to call again. Tempestt S Roberts, CMA  

## 2017-06-07 ENCOUNTER — Telehealth (INDEPENDENT_AMBULATORY_CARE_PROVIDER_SITE_OTHER): Payer: Self-pay

## 2017-06-07 ENCOUNTER — Encounter (INDEPENDENT_AMBULATORY_CARE_PROVIDER_SITE_OTHER): Payer: Self-pay

## 2017-06-07 LAB — HIV ANTIBODY (ROUTINE TESTING W REFLEX): HIV Screen 4th Generation wRfx: NONREACTIVE

## 2017-06-07 LAB — HEPATITIS B VIRUS (PROFILE VI)
HEP B C IGM: NEGATIVE
HEP B C TOTAL AB: POSITIVE — AB
HEP B E AG: NEGATIVE
HEP B S AG: POSITIVE — AB
Hep B E Ab: POSITIVE — AB
Hep B Surface Ab, Qual: NONREACTIVE

## 2017-06-07 NOTE — Telephone Encounter (Signed)
Left patient a message detailing the following results, HIV negative. Seems he is a chronic carrier of Hepatitis B. I will send him to ID. Asked patient to call the office with any questions. Maryjean Mornempestt S Dearl Rudden, CMA

## 2017-06-07 NOTE — Telephone Encounter (Signed)
-----   Message from Roger David Gomez, PA-C sent at 06/06/2017  8:45 AM EST ----- HIV negative. Seems he is a chronic carrier of Hepatitis B. I will send him to ID. 

## 2017-06-18 ENCOUNTER — Ambulatory Visit (INDEPENDENT_AMBULATORY_CARE_PROVIDER_SITE_OTHER): Payer: BLUE CROSS/BLUE SHIELD | Admitting: Infectious Diseases

## 2017-06-18 ENCOUNTER — Encounter: Payer: Self-pay | Admitting: Infectious Diseases

## 2017-06-18 VITALS — BP 143/89 | HR 67 | Temp 98.4°F | Ht 64.0 in | Wt 125.0 lb

## 2017-06-18 DIAGNOSIS — K219 Gastro-esophageal reflux disease without esophagitis: Secondary | ICD-10-CM

## 2017-06-18 DIAGNOSIS — B181 Chronic viral hepatitis B without delta-agent: Secondary | ICD-10-CM

## 2017-06-18 NOTE — Progress Notes (Signed)
Martin Wright 03-Sep-1975 [id number] PCP: Loletta SpecterGomez, Roger David, PA-C  Referring Provider: Sindy Messingoger Gomez, PA-C  Reason for Visit: Hep B - initial consultation   HPI: Martin Wright is a 42 y.o. male here today for evaluation of his chronic hepatitis b infection. He is originally from [place]. He has known he has had hepatitis b since coming to the [place] with required testing/immunizations. He is currently married to his wife of 9 years and pregnant with their 3rd child. Their 2 older children currently live in [place]. He is not exactly sure of his wife's Hep B status/immunity nor his children. He does not have any siblings whom are infected and he does not believe his mother has it. He did [place] and has had several male sexual partners where he had unprotected sex.   Patient Active Problem List   Diagnosis Date Noted  . GERD (gastroesophageal reflux disease) 06/21/2017  . Chronic viral hepatitis B without delta-agent (HCC) 06/18/2017   ROS  No past medical history on file.  Social History   Tobacco Use  . Smoking status: Never Smoker  . Smokeless tobacco: Never Used  Substance Use Topics  . Alcohol use: No    Frequency: Never  . Drug use: No    No family history on file.   No Known Allergies   OBJECTIVE: Vitals:   06/18/17 1356  BP: (!) 143/89  Pulse: 67  Temp: 98.4 F (36.9 C)  TempSrc: Oral  Weight: 125 lb (56.7 kg)  Height: 5\' 4"  (1.626 m)   Body mass index is 21.46 kg/m.  Physical Exam  Constitutional: He is oriented to person, place, and time and well-developed, well-nourished, and in no distress.  HENT:  Mouth/Throat: No oral lesions. Normal dentition. No dental caries.  Eyes: No scleral icterus.  Cardiovascular: Normal rate, regular rhythm and normal heart sounds.  Pulmonary/Chest: Effort normal and breath sounds normal.  Abdominal: Soft. He  exhibits no distension. There is no tenderness.  Lymphadenopathy:    He has no cervical adenopathy.  Neurological: He is alert and oriented to person, place, and time.  Skin: Skin is warm and dry. No rash noted.  Psychiatric: Mood and affect normal.  Vitals reviewed.     ASSESSMENT & PLAN:  Problem List Items Addressed This Visit      Digestive   Chronic viral hepatitis B without delta-agent (HCC) - Primary    Potential for transfusion associated or sexually acquired infection in learning about his risk factors and history. His HBeAb positive and normal liver function studies. Liver normal on recent abdominal imaging. Will need to check HBV DNA to further determine status but likely inactive CHB. Will also check serum fibrosis panel as well as hepatitis A immunity (acute panel was negative recently).   He will return in 3 months for repeat lab work. Advised to follow up on his wife's status with OB - if she is in care her in [place] I am certain they have checked surface antigen, however would like to see immunity proven. Discussed hep b precautions with him today.       Relevant Orders   Hepatitis B DNA, ultraquantitative, PCR   Hepatitis A antibody, total (Completed)   Liver Fibrosis, FibroTest-ActiTest   Hepatitis B DNA, ultraquantitative, PCR   Comprehensive metabolic panel   GERD (gastroesophageal reflux disease)    Positive for H.Pylori infection -  currently getting treatment and reports improved symptoms. Continue plan per primary team/GI.         Rexene Alberts, MSN, [facility]  06/18/17 2:18 PM

## 2017-06-18 NOTE — Patient Instructions (Addendum)
We will check some lab work for you today to monitor your hepatitis b.   Please have your wife discuss with her pregnancy doctor about making sure that she has been tested for hepatitis b infection.   Please use condoms during ongoing sexual encounters until we have more information.   Please come back in 3 months with labs 2 weeks before to monitor you.    Hepatitis B Hepatitis B is a liver infection. It is caused by a germ. This germ is passed from person to person through:  Blood.  Birth.  Sex.  Body fluids, like breast milk, tears, and spit (saliva).  Follow these instructions at home:  Rest when you feel tired.  Avoid alcohol.  Take medicines only as told by your doctor.  Do not take any medicine unless your doctor says it is okay. This includes fever or pain medicine.  Do not have sex unless your doctor says it is okay.  Do not share toothbrushes, nail clippers, razors, or needles with others. Get help right away if:  You are not able to eat or drink.  You have a fever and feel sick to your stomach (nauseous) or throw up (vomit).  You feel confused.  Your skin or the whites of your eyes look yellow (jaundice).  You have trouble breathing.  You get a rash.  Your skin, throat, mouth, or face becomes puffy (swollen).  You start twitching or shaking (seizure).  You are very sleepy and have trouble waking up. This information is not intended to replace advice given to you by your health care provider. Make sure you discuss any questions you have with your health care provider. Document Released: 10/14/2008 Document Revised: 11/03/2015 Document Reviewed: 09/04/2013 Elsevier Interactive Patient Education  2017 [employer]

## 2017-06-19 LAB — HEPATITIS A ANTIBODY, TOTAL: HEPATITIS A AB,TOTAL: REACTIVE — AB

## 2017-06-21 DIAGNOSIS — K219 Gastro-esophageal reflux disease without esophagitis: Secondary | ICD-10-CM | POA: Insufficient documentation

## 2017-06-21 LAB — LIVER FIBROSIS, FIBROTEST-ACTITEST
ALPHA-2-MACROGLOBULIN: 164 mg/dL (ref 106–279)
ALT: 15 U/L (ref 9–46)
Apolipoprotein A1: 126 mg/dL (ref 94–176)
Bilirubin: 0.7 mg/dL (ref 0.2–1.2)
FIBROSIS SCORE: 0.2
GGT: 17 U/L (ref 3–95)
Haptoglobin: 85 mg/dL (ref 43–212)
Necroinflammat ACT Score: 0.04
Reference ID: 2278979

## 2017-06-21 NOTE — Assessment & Plan Note (Signed)
Positive for H.Pylori infection - currently getting treatment and reports improved symptoms. Continue plan per primary team/GI.

## 2017-06-21 NOTE — Assessment & Plan Note (Signed)
Potential for transfusion associated or sexually acquired infection in learning about his risk factors and history. His HBeAb positive and normal liver function studies. Liver normal on recent abdominal imaging. Will need to check HBV DNA to further determine status but likely inactive CHB. Will also check serum fibrosis panel as well as hepatitis A immunity (acute panel was negative recently).   He will return in 3 months for repeat lab work. Advised to follow up on his wife's status with OB - if she is in care her in [place] I am certain they have checked surface antigen, however would like to see immunity proven. Discussed hep b precautions with him today.

## 2017-07-17 ENCOUNTER — Ambulatory Visit (INDEPENDENT_AMBULATORY_CARE_PROVIDER_SITE_OTHER): Payer: BLUE CROSS/BLUE SHIELD | Admitting: Physician Assistant

## 2017-07-17 ENCOUNTER — Encounter (INDEPENDENT_AMBULATORY_CARE_PROVIDER_SITE_OTHER): Payer: Self-pay | Admitting: Physician Assistant

## 2017-07-17 VITALS — BP 136/87 | HR 74 | Temp 98.6°F | Resp 18 | Ht 64.0 in | Wt 126.0 lb

## 2017-07-17 DIAGNOSIS — A048 Other specified bacterial intestinal infections: Secondary | ICD-10-CM | POA: Diagnosis not present

## 2017-07-17 DIAGNOSIS — R630 Anorexia: Secondary | ICD-10-CM

## 2017-07-17 DIAGNOSIS — H11153 Pinguecula, bilateral: Secondary | ICD-10-CM

## 2017-07-17 MED ORDER — DRONABINOL 2.5 MG PO CAPS: 2.5000 mg | ORAL_CAPSULE | Freq: Two times a day (BID) | ORAL | 0 refills | Status: DC

## 2017-07-17 MED ORDER — CARBOXYMETHYLCELLULOSE SODIUM 0.25 % OP SOLN: 1.0000 [drp] | Freq: Two times a day (BID) | OPHTHALMIC | 0 refills | Status: DC | PRN

## 2017-07-17 NOTE — Patient Instructions (Signed)
Pterygium Excision  Pterygium excision is a surgical procedure to remove a pterygium, which is a noncancerous (benign) fleshy growth on the front surface of the eye. A pterygium can start on the clear outer tissue of the eye (conjunctiva). It spreads to cover the white part of the eye (sclera) and extends onto the clear tissue that covers the pupil (cornea).  You may need this surgery if you have a pterygium that is causing discomfort or affecting your vision. Pterygium excision may be needed if other treatments are not working. You may also choose to have this surgery to improve the appearance of your eye (cosmetic surgery).  Tell a health care provider about:  · Any allergies you have.  · All medicines you are taking, including vitamins, herbs, eye drops, creams, and over-the-counter medicines.  · Any problems you or family members have had with anesthetic medicines.  · Any blood disorders you have.  · Any surgeries you have had.  · Any medical conditions you have.  What are the risks?  Generally, this is a safe procedure. However, problems may occur, including:  · Having the pterygium come back after surgery.  · Eye pain.  · Vision changes (astigmatism).  · Feeling like there is something in your eye.  · Scar formation on the eye (granuloma).    What happens before the procedure?  · Ask your health care provider about:  ? Changing or stopping your regular medicines. This is especially important if you are taking diabetes medicines or blood thinners.  ? Taking medicines such as aspirin and ibuprofen. These medicines can thin your blood. Do not take these medicines before your procedure if your health care provider instructs you not to.  · Plan to have someone take you home after the procedure.  · If you go home right after the procedure, plan to have someone with you for 24 hours.  What happens during the procedure?  · An IV tube will be inserted into one of your veins.   · You will be given one or more of the following:  ? A medicine that helps you relax (sedative).  ? A medicine that numbs the area (local anesthetic).  · After your eye is numb, your health care provider will use a device (retractor) to hold your eyelid open.  · The pterygium will be removed and lifted away from your eye.  · You may have a piece of eye tissue (graft) attached to the surface of your eye where the pterygium was removed. This graft may be taken from the conjunctiva at the outer lining of your eye on the side that is away from your pterygium excision.  · The graft may be held in place with tiny absorbable stitches (sutures) or with a type of glue.  · Your eye will be closed, and an eye patch will be placed over your eye.  The procedure may vary among health care providers and hospitals.  What happens after the procedure?  · Your blood pressure, heart rate, breathing rate, and blood oxygen level will be monitored often until the medicines you were given have worn off.  · You will be given pain medicine as needed.  · You will need to wear your eye patch as directed by your health care provider.  This information is not intended to replace advice given to you by your health care provider. Make sure you discuss any questions you have with your health care provider.  Document Released: 02/20/2001 Document   Revised: 11/03/2015 Document Reviewed: 02/10/2014  Elsevier Interactive Patient Education © 2018 Elsevier Inc.

## 2017-07-17 NOTE — Progress Notes (Signed)
Subjective:  Patient ID: Martin Wright, male Martin Wright   DOB: December 29, 1975  Age: 42 y.o. MRN: [id number]  CC:  HPI Martin Sallesbraham Kpah Cortez is a 42 y.o. male with a medical history of H pylori infection presents for H pylori breath test. He has completed antibiotic treatment and PPI. Feeling much better in regards to epigastric pain. Occasional discomfort with certain foods but much better than before antibiotic treatment. Does not endorse any other symptom or complaints beside occasional tearing of the eyes and poor appetite. Previously prescribed dronabinol but was unable to fill due to price.         Outpatient Medications Prior to Visit  Medication Sig Dispense Refill  . dronabinol (MARINOL) 2.5 MG capsule Take 1 capsule (2.5 mg total) by mouth 2 (two) times daily before a meal. 60 capsule 0  . ipratropium (ATROVENT) 0.06 % nasal spray Place 2 sprays into both nostrils 4 (four) times daily. (Patient not taking: Reported on 06/18/2017) 15 mL 0  . loperamide (IMODIUM A-D) 2 MG tablet Take 1 tablet (2 mg total) by mouth 4 (four) times daily as needed for diarrhea or loose stools. (Patient not taking: Reported on 06/18/2017) 30 tablet 0  . meclizine (ANTIVERT) 25 MG tablet Take 1 tablet (25 mg total) by mouth 3 (three) times daily as needed for dizziness. (Patient not taking: Reported on 06/18/2017) 30 tablet 0  . omeprazole (PRILOSEC) 40 MG capsule Take 1 capsule (40 mg total) by mouth daily. 28 capsule 0  . ranitidine (ZANTAC) 300 MG tablet Take 1 tablet (300 mg total) by mouth at bedtime. (Patient not taking: Reported on 06/18/2017) 30 tablet 2   No facility-administered medications prior to visit.      ROS Review of Systems  Constitutional: Negative for chills, fever and malaise/fatigue.  Eyes: Negative for blurred vision.  Respiratory: Negative for shortness of breath.   Cardiovascular: Negative for chest pain and palpitations.  Gastrointestinal: Negative for abdominal pain and nausea.   Genitourinary: Negative for dysuria and hematuria.  Musculoskeletal: Negative for joint pain and myalgias.  Skin: Negative for rash.  Neurological: Negative for tingling and headaches.  Psychiatric/Behavioral: Negative for depression. The patient is not nervous/anxious.     Objective:  There were no vitals taken for this visit.  BP/Weight 06/18/2017 06/05/2017 05/07/2017  Systolic BP 143 147 135  Diastolic BP 89 89 89  Wt. (Lbs) 125 128 120.4  BMI 21.46 21.07 20.35      Physical Exam  Constitutional: He is oriented to person, place, and time.  Well developed, thin, NAD, polite  HENT:  Head: Normocephalic and atraumatic.  Eyes: No scleral icterus.  Small fleshy mass on the medial aspect of eye  Neck: Normal range of motion. Neck supple. No thyromegaly present.  Cardiovascular: Normal rate, regular rhythm and normal heart sounds.  Pulmonary/Chest: Effort normal and breath sounds normal. No respiratory distress. He has no wheezes.  Abdominal: Soft. Bowel sounds are normal. He exhibits no distension. There is no tenderness.  Musculoskeletal: He exhibits no edema.  Neurological: He is alert and oriented to person, place, and time. No cranial nerve deficit. Coordination normal.  Skin: Skin is warm and dry. No rash noted. No erythema. No pallor.  Psychiatric: He has a normal mood and affect. His behavior is normal. Thought content normal.  Vitals reviewed.    Assessment & Plan:    1. H. pylori infection - H. pylori breath test  2. Pinguecula of both eyes - Carboxymethylcellulose Sodium (THERATEARS)  0.25 % SOLN; Apply 1 drop to eye 2 (two) times daily as needed.  Dispense: 15 each; Refill: 0 - Ambulatory referral to Ophthalmology  3. Poor appetite - dronabinol (MARINOL) 2.5 MG capsule; Take 1 capsule (2.5 mg total) by mouth 2 (two) times daily before a meal.  Dispense: 60 capsule; Refill: 0   Meds ordered this encounter  Medications  . Carboxymethylcellulose Sodium  (THERATEARS) 0.25 % SOLN    Sig: Apply 1 drop to eye 2 (two) times daily as needed.    Dispense:  15 each    Refill:  0    Order Specific Question:   Supervising Provider    Answer:   Quentin Angst [id number]  . dronabinol (MARINOL) 2.5 MG capsule    Sig: Take 1 capsule (2.5 mg total) by mouth 2 (two) times daily before a meal.    Dispense:  60 capsule    Refill:  0    Order Specific Question:   Supervising Provider    Answer:   Quentin Angst [id number]    Follow-up: Return if symptoms worsen or fail to improve.   Loletta Specter PA

## 2017-07-19 ENCOUNTER — Telehealth (INDEPENDENT_AMBULATORY_CARE_PROVIDER_SITE_OTHER): Payer: Self-pay | Admitting: *Deleted

## 2017-07-19 LAB — H. PYLORI BREATH TEST: H pylori Breath Test: NEGATIVE

## 2017-07-19 NOTE — Telephone Encounter (Signed)
Medical Assistant left message on patient's home and cell voicemail. Voicemail states to [place] [facility] [phone]. !!!Please inform patient of h pylori bacteria being gone!!!

## 2017-07-19 NOTE — Telephone Encounter (Signed)
-----   Message from Loletta Specteroger David Gomez, PA-C sent at 07/19/2017 12:07 PM EST ----- H pylori is eliminated.

## 2018-08-05 ENCOUNTER — Encounter (HOSPITAL_COMMUNITY): Payer: Self-pay | Admitting: Emergency Medicine

## 2018-08-05 ENCOUNTER — Ambulatory Visit (HOSPITAL_COMMUNITY)
Admission: EM | Admit: 2018-08-05 | Discharge: 2018-08-05 | Disposition: A | Discharge status: Home / Self Care | Payer: BLUE CROSS/BLUE SHIELD | Attending: Emergency Medicine | Admitting: Emergency Medicine

## 2018-08-05 DIAGNOSIS — R69 Illness, unspecified: Secondary | ICD-10-CM

## 2018-08-05 DIAGNOSIS — J111 Influenza due to unidentified influenza virus with other respiratory manifestations: Secondary | ICD-10-CM

## 2018-08-05 MED ORDER — OSELTAMIVIR PHOSPHATE 75 MG PO CAPS: 75.0000 mg | ORAL_CAPSULE | Freq: Two times a day (BID) | ORAL | 0 refills | Status: DC

## 2018-08-05 MED ORDER — PSEUDOEPH-BROMPHEN-DM 30-2-10 MG/5ML PO SYRP: 5.0000 mL | ORAL_SOLUTION | Freq: Four times a day (QID) | ORAL | 0 refills | Status: DC | PRN

## 2018-08-05 MED ORDER — FLUTICASONE PROPIONATE 50 MCG/ACT NA SUSP: 2.0000 | Freq: Every day | NASAL | 0 refills | Status: DC

## 2018-08-05 MED ORDER — IBUPROFEN 600 MG PO TABS: 600.0000 mg | ORAL_TABLET | Freq: Four times a day (QID) | ORAL | 0 refills | Status: DC | PRN

## 2018-08-05 NOTE — ED Provider Notes (Signed)
HPI  SUBJECTIVE:  Martin Wright is a 43 y.o. male who presents with the acute onset of body aches, nasal congestion, rhinorrhea, nonproductive cough, sneezing and headache starting 2 days ago.  States that he felt feverish but did not take his temperature at home.  Does not have a thermometer.  No sinus pain or pressure, ear pain, postnasal drip.  No neck stiffness, photophobia, sore throat.  No itchy, watery eyes, abdominal pain, vomiting, diarrhea.  No wheezing, chest pain, shortness of breath.  He is able to sleep at night without coughing.  He did not get a flu shot this year.  No contacts with flu.  No antipyretic in the past 4 to 6 hours.  He tried Tylenol with improvement in his symptoms.  No aggravating factors.  Past medical history of hepatitis B, treated.  No history of asthma, diabetes, hypertension, kidney disease.  PMD: [facility].  History reviewed. No pertinent past medical history.  History reviewed. No pertinent surgical history.  No family history on file.  Social History   Tobacco Use  . Smoking status: Never Smoker  . Smokeless tobacco: Never Used  Substance Use Topics  . Alcohol use: No    Frequency: Never  . Drug use: No    No current facility-administered medications for this encounter.   Current Outpatient Medications:  .  fluticasone (FLONASE) 50 MCG/ACT nasal spray, Place 2 sprays into both nostrils daily., Disp: 16 g, Rfl: 0 .  ibuprofen (ADVIL,MOTRIN) 600 MG tablet, Take 1 tablet (600 mg total) by mouth every 6 (six) hours as needed., Disp: 30 tablet, Rfl: 0 .  oseltamivir (TAMIFLU) 75 MG capsule, Take 1 capsule (75 mg total) by mouth 2 (two) times daily. X 5 days, Disp: 10 capsule, Rfl: 0  No Known Allergies   ROS  As noted in HPI.   Physical Exam  BP (!) 138/92   Pulse 77   Temp 99.2 F (37.3 C)   Resp 16   SpO2 100%   Constitutional: Well developed, well nourished, no acute distress Eyes: PERRL, EOMI, conjunctiva normal  bilaterally HENT: Normocephalic, atraumatic,mucus membranes moist.  Positive clear nasal congestion.  Positive mild frontal sinus tenderness.  No maxillary sinus tenderness.  Normal tonsils without exudates.  Normal oropharynx, uvula midline.  No cobblestoning, postnasal drip. Neck: Positive shotty cervical lymphadenopathy.  No meningismus. Respiratory: Clear to auscultation bilaterally, no rales, no wheezing, no rhonchi Cardiovascular: Normal rate and rhythm, no murmurs, no gallops, no rubs GI: Soft, nondistended, normal bowel sounds, nontender, no rebound, no guarding Back: no CVAT skin: No rash, skin intact Musculoskeletal: No edema, no tenderness, no deformities Neurologic: Alert & oriented x 3, CN II-XII grossly intact, no motor deficits, sensation grossly intact Psychiatric: Speech and behavior appropriate   ED Course   Medications - No data to display  No orders of the defined types were placed in this encounter.  No results found for this or any previous visit (from the past 24 hour(s)). No results found.  ED Clinical Impression  Influenza-like illness   ED Assessment/Plan  Presentation consistent with an influenza-like illness.  Will treat as an influenza with Tamiflu, supportive treatment including Flonase, saline nasal irrigation, ibuprofen 600 mg combined with 1 g of Tylenol.  Advised patient to buy a thermometer so he can monitor his temperature at home.  Also work note for today tomorrow.  Follow-up with his PMD as needed, to the ER if he gets worse  Discussed  MDM, treatment plan, and plan  for follow-up with patient Discussed sn/sx that should prompt return to the ED. patient agrees with plan.   Meds ordered this encounter  Medications  . fluticasone (FLONASE) 50 MCG/ACT nasal spray    Sig: Place 2 sprays into both nostrils daily.    Dispense:  16 g    Refill:  0  . oseltamivir (TAMIFLU) 75 MG capsule    Sig: Take 1 capsule (75 mg total) by mouth 2 (two) times  daily. X 5 days    Dispense:  10 capsule    Refill:  0  . DISCONTD: brompheniramine-pseudoephedrine-DM 30-2-10 MG/5ML syrup    Sig: Take 5 mLs by mouth 4 (four) times daily as needed.    Dispense:  120 mL    Refill:  0  . ibuprofen (ADVIL,MOTRIN) 600 MG tablet    Sig: Take 1 tablet (600 mg total) by mouth every 6 (six) hours as needed.    Dispense:  30 tablet    Refill:  0    *This clinic note was created using Scientist, clinical (histocompatibility and immunogenetics). Therefore, there may be occasional mistakes despite careful proofreading.  ?   Domenick Gong, MD 08/05/18 1058

## 2018-08-05 NOTE — Discharge Instructions (Addendum)
Tamiflu, Flonase, saline nasal irrigation with a Lloyd Huger med sinus rinse and distilled water as often as you want for the nasal congestion and to help prevent a bacterial sinus infection, ibuprofen 600 mg combined with 1 g of Tylenol 3 or 4 times a day as needed for headache, fever.  Please buy a thermometer so that you can monitor your temperature at home. Make sure you push plenty of extra fluids such as Pedialyte and Gatorade.

## 2018-08-05 NOTE — ED Triage Notes (Signed)
Pt c/o cough, body aches, sneezing since Sunday.

## 2018-08-26 ENCOUNTER — Other Ambulatory Visit: Payer: Self-pay

## 2018-08-26 ENCOUNTER — Ambulatory Visit (HOSPITAL_COMMUNITY)
Admission: EM | Admit: 2018-08-26 | Discharge: 2018-08-26 | Disposition: A | Discharge status: Home / Self Care | Payer: BLUE CROSS/BLUE SHIELD | Attending: Family Medicine | Admitting: Family Medicine

## 2018-08-26 ENCOUNTER — Encounter (HOSPITAL_COMMUNITY): Payer: Self-pay

## 2018-08-26 DIAGNOSIS — L0291 Cutaneous abscess, unspecified: Secondary | ICD-10-CM

## 2018-08-26 DIAGNOSIS — R141 Gas pain: Secondary | ICD-10-CM

## 2018-08-26 MED ORDER — SULFAMETHOXAZOLE-TRIMETHOPRIM 800-160 MG PO TABS: 1.0000 | ORAL_TABLET | Freq: Two times a day (BID) | ORAL | 0 refills | Status: AC

## 2018-08-26 NOTE — ED Triage Notes (Signed)
Patient presents to Urgent Care with complaints of abscess under chin from shaving about a week ago, pt states he tried to squeeze it but not much came out, it is draining some pus at this time, tissue provided during triage. Patient states he also has some abdominal cramping but has been having regular BMs.

## 2018-08-26 NOTE — Discharge Instructions (Addendum)
Warm compresses to chin 2-3 times a day Take antibiotic 2 times a day Expect improvement over next few days You may take ibuprofen if needed for pain  For the gas pain in your stomach try a product with simethicone such as Gas X or Mylanta plus

## 2018-08-26 NOTE — ED Provider Notes (Signed)
[facility]    CSN: [id number] Arrival date & time: 08/26/18  1850     History   Chief Complaint Chief Complaint  Patient presents with  . Abscess    HPI Davidson Saleh is a 43 y.o. male.   HPI  Patient states that he has a large cyst underneath his chin.  It is an infection.  He thinks it is from shaving.  He is here to have it drained.  It is painful with touch.  No fever. He also complains of abdominal pain intermittently.  He calls it "gas pain".  It is in the center of his belly.  It is not there every day.  He cannot tell that it is related to food.  It does not happen at night.  No nausea vomiting or diarrhea.  He does not smoke.  He does not drink alcohol.  He does not take ibuprofen.  He does not endorse any stress  History reviewed. No pertinent past medical history.  Patient Active Problem List   Diagnosis Date Noted  . GERD (gastroesophageal reflux disease) 06/21/2017  . Chronic viral hepatitis B without delta-agent ([facility]) 06/18/2017    History reviewed. No pertinent surgical history.     Home Medications    Prior to Admission medications   Medication Sig Start Date End Date Taking? Authorizing Provider  ibuprofen (ADVIL,MOTRIN) 600 MG tablet Take 1 tablet (600 mg total) by mouth every 6 (six) hours as needed. 08/05/18   Domenick Gong, MD  sulfamethoxazole-trimethoprim (BACTRIM DS,SEPTRA DS) 800-160 MG tablet Take 1 tablet by mouth 2 (two) times daily for 7 days. 08/26/18 09/02/18  Eustace Moore, MD    Family History History reviewed. No pertinent family history.  Social History Social History   Tobacco Use  . Smoking status: Never Smoker  . Smokeless tobacco: Never Used  Substance Use Topics  . Alcohol use: No    Frequency: Never  . Drug use: No     Allergies   Patient has no known allergies.   Review of Systems Review of Systems  Constitutional: Negative for chills and fever.  HENT: Negative for ear pain and sore  throat.   Eyes: Negative for pain and visual disturbance.  Respiratory: Negative for cough and shortness of breath.   Cardiovascular: Negative for chest pain and palpitations.  Gastrointestinal: Positive for abdominal pain. Negative for vomiting.  Genitourinary: Negative for dysuria and hematuria.  Musculoskeletal: Negative for arthralgias and back pain.  Skin: Positive for wound. Negative for color change and rash.  Neurological: Negative for seizures and syncope.  All other systems reviewed and are negative.    Physical Exam Triage Vital Signs ED Triage Vitals  Enc Vitals Group     BP 08/26/18 1919 (!) 147/91     Pulse Rate 08/26/18 1919 (!) 56     Resp 08/26/18 1919 17     Temp 08/26/18 1919 98.3 F (36.8 C)     Temp Source 08/26/18 1919 Oral     SpO2 08/26/18 1919 100 %     Weight 08/26/18 1918 125 lb 12.8 oz (57.1 kg)     Height 08/26/18 1918 5\' 5"  (1.651 m)     Head Circumference --      Peak Flow --      Pain Score 08/26/18 1918 6     Pain Loc --      Pain Edu? --      Excl. in GC? --    No data  found.  Updated Vital Signs BP (!) 147/91 (BP Location: Right Arm)   Pulse (!) 56   Temp 98.3 F (36.8 C) (Oral)   Resp 17   Ht 5\' 5"  (1.651 m)   Wt 57.1 kg   SpO2 100%   BMI 20.93 kg/m         Physical Exam Constitutional:      General: He is not in acute distress.    Appearance: He is well-developed.  HENT:     Head: Normocephalic and atraumatic.   Eyes:     Conjunctiva/sclera: Conjunctivae normal.     Pupils: Pupils are equal, round, and reactive to light.  Neck:     Musculoskeletal: Normal range of motion.  Cardiovascular:     Rate and Rhythm: Normal rate and regular rhythm.     Heart sounds: Normal heart sounds.  Pulmonary:     Effort: Pulmonary effort is normal. No respiratory distress.     Breath sounds: Normal breath sounds.  Abdominal:     General: Abdomen is flat. Bowel sounds are normal. There is no distension.     Palpations: Abdomen is  soft.     Comments: Abdomen is benign  Musculoskeletal: Normal range of motion.  Lymphadenopathy:     Cervical: Cervical adenopathy present.  Skin:    General: Skin is warm and dry.  Neurological:     Mental Status: He is alert.  Psychiatric:        Mood and Affect: Mood normal.        Behavior: Behavior normal.      UC Treatments / Results  Labs (all labs ordered are listed, but only abnormal results are displayed) Labs Reviewed - No data to display  EKG None  Radiology No results found.  Procedures Incision and Drainage Date/Time: 08/26/2018 8:28 PM Performed by: Eustace Moore, MD Authorized by: Eustace Moore, MD   Consent:    Consent obtained:  Verbal   Consent given by:  Patient   Risks discussed:  Bleeding and incomplete drainage   Alternatives discussed:  Delayed treatment Location:    Type:  Abscess   Location:  Head (Under chin) Pre-procedure details:    Skin preparation:  Betadine Anesthesia (see MAR for exact dosages):    Anesthesia method:  Local infiltration   Local anesthetic:  Lidocaine 1% w/o epi Procedure type:    Complexity:  Simple Procedure details:    Incision types:  Stab incision   Incision depth:  Subcutaneous   Scalpel blade:  11   Wound management:  Probed and deloculated   Drainage:  Purulent   Drainage amount:  Copious   Wound treatment:  Drain placed   Packing materials:  1/2 in gauze Post-procedure details:    Patient tolerance of procedure:  Tolerated well, no immediate complications    Medications Ordered in UC Medications - No data to display  Initial Impression / Assessment and Plan / UC Course  I have reviewed the triage vital signs and the nursing notes.  Pertinent labs & imaging results that were available during my care of the patient were reviewed by me and considered in my medical decision making (see chart for details).      Final Clinical Impressions(s) / UC Diagnoses   Final diagnoses:   Abscess  Abdominal gas pain     Discharge Instructions     Warm compresses to chin 2-3 times a day Take antibiotic 2 times a day Expect improvement over next few days  You may take ibuprofen if needed for pain  For the gas pain in your stomach try a product with simethicone such as Gas X or Mylanta plus   ED Prescriptions    Medication Sig Dispense Auth. Provider   sulfamethoxazole-trimethoprim (BACTRIM DS,SEPTRA DS) 800-160 MG tablet Take 1 tablet by mouth 2 (two) times daily for 7 days. 14 tablet Eustace Moore, MD     Controlled Substance Prescriptions [place] Controlled Substance Registry consulted? Not Applicable   Eustace Moore, MD 08/26/18 2029

## 2019-05-05 ENCOUNTER — Encounter (HOSPITAL_COMMUNITY): Payer: Self-pay

## 2019-05-05 ENCOUNTER — Ambulatory Visit (HOSPITAL_COMMUNITY)
Admission: EM | Admit: 2019-05-05 | Discharge: 2019-05-05 | Disposition: A | Discharge status: Home / Self Care | Payer: BLUE CROSS/BLUE SHIELD | Attending: Family Medicine | Admitting: Family Medicine

## 2019-05-05 ENCOUNTER — Other Ambulatory Visit: Payer: Self-pay

## 2019-05-05 DIAGNOSIS — B349 Viral infection, unspecified: Secondary | ICD-10-CM | POA: Diagnosis present

## 2019-05-05 DIAGNOSIS — Z20828 Contact with and (suspected) exposure to other viral communicable diseases: Secondary | ICD-10-CM | POA: Diagnosis present

## 2019-05-05 DIAGNOSIS — R6883 Chills (without fever): Secondary | ICD-10-CM | POA: Insufficient documentation

## 2019-05-05 DIAGNOSIS — Z20822 Contact with and (suspected) exposure to covid-19: Secondary | ICD-10-CM

## 2019-05-05 MED ORDER — FLUTICASONE PROPIONATE 50 MCG/ACT NA SUSP: 2.0000 | Freq: Every day | NASAL | 2 refills | Status: DC

## 2019-05-05 MED ORDER — LEVOCETIRIZINE DIHYDROCHLORIDE 5 MG PO TABS: 5.0000 mg | ORAL_TABLET | Freq: Every evening | ORAL | 0 refills | Status: DC

## 2019-05-05 NOTE — ED Provider Notes (Signed)
[facility]    CSN: [id number] Arrival date & time: 05/05/19  1500      History   Chief Complaint Chief Complaint  Patient presents with  . Chills  . Generalized Body Aches    HPI Martin Wright is a 43 y.o. male.   HPI   Patient has been at a normal activity level.  He is going to work every day.  He states every night when he gets home he has chills, headache, and has to take ibuprofen.  He states that this is been going on for a week and half to 2 weeks.  He denies any other symptoms.  No cough or cold.  No shortness of breath.  No fever chills during the day.  No change in appetite.  No change in weight.  No one else at home is sick.  No one else at work is sick.  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  His wife is expecting.  He is to his children.  He states that with ibuprofen he feels normal and goes about the rest of his evening and goes to bed. He specifically rectal questing "to the antibiotic that he got last year when he was sick".  This is Tamiflu.  I told him this is not indicated.  I can give him antihistamines, nasal sprays, other medications for her viral illness symptomatic care but I am not sure what else I can do to help him.  We are doing coronavirus testing.  History reviewed. No pertinent past medical history.  Patient Active Problem List   Diagnosis Date Noted  . GERD (gastroesophageal reflux disease) 06/21/2017  . Chronic viral hepatitis B without delta-agent ([place]) 06/18/2017    History reviewed. No pertinent surgical history.     Home Medications    Prior to Admission medications   Medication Sig Start Date End Date Taking? Authorizing Provider  fluticasone (FLONASE) 50 MCG/ACT nasal spray Place 2 sprays into both nostrils daily. 05/05/19   Raylene Everts, MD  ibuprofen (ADVIL,MOTRIN) 600 MG tablet Take 1 tablet (600 mg total) by mouth every 6 (six) hours as needed. 08/05/18   Melynda Ripple, MD   levocetirizine (XYZAL) 5 MG tablet Take 1 tablet (5 mg total) by mouth every evening. 05/05/19   Raylene Everts, MD    Family History Family History  Family history unknown: Yes    Social History Social History   Tobacco Use  . Smoking status: Never Smoker  . Smokeless tobacco: Never Used  Substance Use Topics  . Alcohol use: No    Frequency: Never  . Drug use: No     Allergies   Patient has no known allergies.   Review of Systems Review of Systems  Constitutional: Positive for chills. Negative for fever.  HENT: Negative for ear pain and sore throat.   Eyes: Negative for pain and visual disturbance.  Respiratory: Negative for cough and shortness of breath.   Cardiovascular: Negative for chest pain and palpitations.  Gastrointestinal: Negative for abdominal pain and vomiting.  Genitourinary: Negative for dysuria and hematuria.  Musculoskeletal: Negative for arthralgias and back pain.  Skin: Negative for color change and rash.  Neurological: Positive for dizziness. Negative for seizures and syncope.  All other systems reviewed and are negative.    Physical Exam Triage Vital Signs ED Triage Vitals  Enc Vitals Group     BP 05/05/19 1508 (!) 154/100  Pulse Rate 05/05/19 1508 82     Resp 05/05/19 1508 17     Temp 05/05/19 1508 99 F (37.2 C)     Temp Source 05/05/19 1508 Oral     SpO2 05/05/19 1508 97 %     Weight --      Height --      Head Circumference --      Peak Flow --      Pain Score 05/05/19 1510 4     Pain Loc --      Pain Edu? --      Excl. in GC? --    No data found.  Updated Vital Signs BP (!) 154/100 (BP Location: Right Arm)   Pulse 82   Temp 99 F (37.2 C) (Oral)   Resp 17   SpO2 97%   Visual Acuity Right Eye Distance:   Left Eye Distance:   Bilateral Distance:    Right Eye Near:   Left Eye Near:    Bilateral Near:     Physical Exam Constitutional:      General: He is not in acute distress.    Appearance: He is  well-developed.  HENT:     Head: Normocephalic and atraumatic.     Nose: Nose normal.     Mouth/Throat:     Mouth: Mucous membranes are moist.     Pharynx: No posterior oropharyngeal erythema.  Eyes:     Conjunctiva/sclera: Conjunctivae normal.     Pupils: Pupils are equal, round, and reactive to light.  Neck:     Musculoskeletal: Normal range of motion.  Cardiovascular:     Rate and Rhythm: Normal rate and regular rhythm.     Heart sounds: Normal heart sounds.  Pulmonary:     Effort: Pulmonary effort is normal. No respiratory distress.     Breath sounds: Normal breath sounds.  Abdominal:     General: There is no distension.     Palpations: Abdomen is soft.  Musculoskeletal: Normal range of motion.  Skin:    General: Skin is warm and dry.  Neurological:     Mental Status: He is alert.   Semination is unremarkable/nonfocal  UC Treatments / Results  Labs (all labs ordered are listed, but only abnormal results are displayed) Labs Reviewed  NOVEL CORONAVIRUS, NAA (HOSP ORDER, SEND-OUT TO REF LAB; TAT 18-24 HRS)    EKG   Radiology No results found.  Procedures Procedures (including critical care time)  Medications Ordered in UC Medications - No data to display  Initial Impression / Assessment and Plan / UC Course  I have reviewed the triage vital signs and the nursing notes.  Pertinent labs & imaging results that were available during my care of the patient were reviewed by me and considered in my medical decision making (see chart for details).      Final Clinical Impressions(s) / UC Diagnoses   Final diagnoses:  Viral syndrome  Chills  Encounter for laboratory testing for COVID-19 virus     Discharge Instructions     Continue to drink plenty of fluids Take ibuprofen every evening as needed In addition take 1 levocetirizine every evening Use the Flonase twice a day until your symptoms are improved Call or return for problems You will be notified if  your test is positive   ED Prescriptions    Medication Sig Dispense Auth. Provider   fluticasone (FLONASE) 50 MCG/ACT nasal spray Place 2 sprays into both nostrils daily. 16 g Eustace MooreNelson, Naithan Delage Sue,  MD   levocetirizine (XYZAL) 5 MG tablet Take 1 tablet (5 mg total) by mouth every evening. 14 tablet Eustace Moore, MD     PDMP not reviewed this encounter.   Eustace Moore, MD 05/05/19 2051

## 2019-05-05 NOTE — ED Triage Notes (Signed)
Pt presents with generalized body aches and chills X 2 weeks.

## 2019-05-05 NOTE — Discharge Instructions (Addendum)
Continue to drink plenty of fluids Take ibuprofen every evening as needed In addition take 1 levocetirizine every evening Use the Flonase twice a day until your symptoms are improved Call or return for problems You will be notified if your test is positive

## 2019-05-07 LAB — NOVEL CORONAVIRUS, NAA (HOSP ORDER, SEND-OUT TO REF LAB; TAT 18-24 HRS): SARS-CoV-2, NAA: NOT DETECTED

## 2019-05-08 ENCOUNTER — Ambulatory Visit (HOSPITAL_COMMUNITY)
Admission: EM | Admit: 2019-05-08 | Discharge: 2019-05-08 | Disposition: A | Discharge status: Home / Self Care | Payer: BLUE CROSS/BLUE SHIELD | Attending: Family Medicine | Admitting: Family Medicine

## 2019-05-08 ENCOUNTER — Encounter (HOSPITAL_COMMUNITY): Payer: Self-pay

## 2019-05-08 ENCOUNTER — Other Ambulatory Visit: Payer: Self-pay

## 2019-05-08 DIAGNOSIS — Z03818 Encounter for observation for suspected exposure to other biological agents ruled out: Secondary | ICD-10-CM

## 2019-05-08 DIAGNOSIS — R519 Headache, unspecified: Secondary | ICD-10-CM

## 2019-05-08 DIAGNOSIS — Z20822 Contact with and (suspected) exposure to covid-19: Secondary | ICD-10-CM

## 2019-05-08 DIAGNOSIS — R52 Pain, unspecified: Secondary | ICD-10-CM

## 2019-05-08 MED ORDER — NAPROXEN 500 MG PO TABS: 500.0000 mg | ORAL_TABLET | Freq: Two times a day (BID) | ORAL | 0 refills | Status: DC

## 2019-05-08 NOTE — ED Triage Notes (Signed)
Pt presents with continued headache and generalized body aches; Pt says he has no relief with medication.  Pt had negative covid less than a week ago.

## 2019-05-08 NOTE — ED Provider Notes (Signed)
[place]    CSN: [id number] Arrival date & time: 05/08/19  1217      History   Chief Complaint Chief Complaint  Patient presents with  . Follow-up    HPI Martin Wright is a 43 y.o. male.   Martin Wright presents with complaints of headache and occasional body aches. No vision changes. No dizziness. No cough or congestion. No nasal drainage. No ear pain. Mild left throat pain, noted with swallowing. States he had similar 1 year ago and the medications helped, per chart review was given ibuprofen, flonase and tamiflu for an influenza like illness. His symptoms originated currently 2 weeks ago. Was seen here three days ago and provided with xyzal and flonase. He has been taking ibuprofen intermittently which does help. Hasn't taken it today, however. Headache is 7/10 currently, it is improved from usual for him. Negative covid testing with visit three days ago. No known ill contacts. Headache is throbbing.    ROS per HPI, negative if not otherwise mentioned.      History reviewed. No pertinent past medical history.  Patient Active Problem List   Diagnosis Date Noted  . GERD (gastroesophageal reflux disease) 06/21/2017  . Chronic viral hepatitis B without delta-agent ([place]) 06/18/2017    History reviewed. No pertinent surgical history.     Home Medications    Prior to Admission medications   Medication Sig Start Date End Date Taking? Authorizing Provider  fluticasone (FLONASE) 50 MCG/ACT nasal spray Place 2 sprays into both nostrils daily. 05/05/19   Raylene Everts, MD  levocetirizine (XYZAL) 5 MG tablet Take 1 tablet (5 mg total) by mouth every evening. 05/05/19   Raylene Everts, MD  naproxen (NAPROSYN) 500 MG tablet Take 1 tablet (500 mg total) by mouth 2 (two) times daily. 05/08/19   Zigmund Gottron, NP    Family History Family History  Family history unknown: Yes    Social History Social History   Tobacco Use  . Smoking  status: Never Smoker  . Smokeless tobacco: Never Used  Substance Use Topics  . Alcohol use: No    Frequency: Never  . Drug use: No     Allergies   Patient has no known allergies.   Review of Systems Review of Systems   Physical Exam Triage Vital Signs ED Triage Vitals  Enc Vitals Group     BP 05/08/19 1337 110/62     Pulse Rate 05/08/19 1337 80     Resp 05/08/19 1337 16     Temp 05/08/19 1337 99.7 F (37.6 C)     Temp Source 05/08/19 1337 Oral     SpO2 05/08/19 1337 100 %     Weight --      Height --      Head Circumference --      Peak Flow --      Pain Score 05/08/19 1339 7     Pain Loc --      Pain Edu? --      Excl. in [place]? --    No data found.  Updated Vital Signs BP (!) 143/85 (BP Location: Left Arm)   Pulse 80   Temp 99.7 F (37.6 C) (Oral)   Resp 16   SpO2 100%    Physical Exam Constitutional:      Appearance: He is well-developed.  HENT:     Head: Normocephalic and atraumatic.     Comments: Indicates pain at temples, no firm or tender vessels  palpable Cardiovascular:     Rate and Rhythm: Normal rate.  Pulmonary:     Effort: Pulmonary effort is normal.  Skin:    General: Skin is warm and dry.  Neurological:     Mental Status: He is alert and oriented to person, place, and time.      UC Treatments / Results  Labs (all labs ordered are listed, but only abnormal results are displayed) Labs Reviewed - No data to display  EKG   Radiology No results found.  Procedures Procedures (including critical care time)  Medications Ordered in UC Medications - No data to display  Initial Impression / Assessment and Plan / UC Course  I have reviewed the triage vital signs and the nursing notes.  Pertinent labs & imaging results that were available during my care of the patient were reviewed by me and considered in my medical decision making (see chart for details).     Non toxic. Benign physical exam.  Mildly elevated BP noted. Afebrile.  Negative covid recently. I buprofen does help with his symptoms. No red flag findings. Pain management discussed for likely viral illness with return precautions provided. Patient verbalized understanding and agreeable to plan.  Ambulatory out of clinic without difficulty.    Final Clinical Impressions(s) / UC Diagnoses   Final diagnoses:  Acute nonintractable headache, unspecified headache type  Body aches  Lab test negative for COVID-19 virus     Discharge Instructions     Your recent covid test was negative.  I do recommend using Xyzal as previously prescribed, as well as daily nasal spray, to help with your symptoms.  You can stop ibuprofen and start twice a day naproxen, to help with your headache. Take with food.  If symptoms worsen or do not improve in the next week to return to be seen or to follow up with your PCP.     ED Prescriptions    Medication Sig Dispense Auth. Provider   naproxen (NAPROSYN) 500 MG tablet Take 1 tablet (500 mg total) by mouth 2 (two) times daily. 30 tablet Georgetta Haber, NP     PDMP not reviewed this encounter.   Georgetta Haber, NP 05/08/19 1552

## 2019-05-08 NOTE — Discharge Instructions (Signed)
Your recent covid test was negative.  I do recommend using Xyzal as previously prescribed, as well as daily nasal spray, to help with your symptoms.  You can stop ibuprofen and start twice a day naproxen, to help with your headache. Take with food.  If symptoms worsen or do not improve in the next week to return to be seen or to follow up with your PCP.

## 2019-09-14 ENCOUNTER — Ambulatory Visit (HOSPITAL_COMMUNITY)
Admission: EM | Admit: 2019-09-14 | Discharge: 2019-09-14 | Disposition: A | Discharge status: Home / Self Care | Payer: No Typology Code available for payment source | Attending: Family Medicine | Admitting: Family Medicine

## 2019-09-14 ENCOUNTER — Other Ambulatory Visit: Payer: Self-pay

## 2019-09-14 ENCOUNTER — Encounter (HOSPITAL_COMMUNITY): Payer: Self-pay

## 2019-09-14 DIAGNOSIS — R438 Other disturbances of smell and taste: Secondary | ICD-10-CM | POA: Diagnosis not present

## 2019-09-14 MED ORDER — OMEPRAZOLE 20 MG PO CPDR: 20.0000 mg | DELAYED_RELEASE_CAPSULE | Freq: Every day | ORAL | 1 refills | Status: DC

## 2019-09-14 MED ORDER — CETIRIZINE HCL 10 MG PO TABS: 10.0000 mg | ORAL_TABLET | Freq: Every day | ORAL | 0 refills | Status: DC

## 2019-09-14 NOTE — ED Triage Notes (Signed)
Pt states his saliva has a bad taste like when he just woke up in the morning tastex1 week.

## 2019-09-14 NOTE — Discharge Instructions (Signed)
Take the allergy medicine daily as prescribed.  These bad taste in your mouth can be from mucous postnasal drip in the sinuses. You  can also start taking the omeprazole daily in case this is related to acid reflux. You want to avoid spicy, greasy foods or foods that can flare of acid reflux. Follow up as needed for continued or worsening symptoms

## 2019-09-15 NOTE — ED Provider Notes (Signed)
[place]    CSN: [id number] Arrival date & time: 09/14/19  1444      History   Chief Complaint Chief Complaint  Patient presents with  . bad taste in saliva    HPI Martin Wright is a 45 y.o. male.   Pt is a 44 year old male that presents with "bad taste in mouth". This has been present for the past week.  Symptoms have been constant.  He does have past medical history of GERD and allergies.  Has not been taking any medications.  Reporting some mucus in the back of his throat.  Denies any nasal congestion, rhinorrhea, cough, chest congestion, fevers.  Denies any abdominal pain, nausea or vomiting.  Denies any dental pain or oral swelling.  ROS per HPI      History reviewed. No pertinent past medical history.  Patient Active Problem List   Diagnosis Date Noted  . GERD (gastroesophageal reflux disease) 06/21/2017  . Chronic viral hepatitis B without delta-agent ([place]) 06/18/2017    History reviewed. No pertinent surgical history.     Home Medications    Prior to Admission medications   Medication Sig Start Date End Date Taking? Authorizing Provider  cetirizine (ZYRTEC) 10 MG tablet Take 1 tablet (10 mg total) by mouth daily. 09/14/19   Shaasia Odle, Tressia Miners A, NP  fluticasone (FLONASE) 50 MCG/ACT nasal spray Place 2 sprays into both nostrils daily. 05/05/19   Raylene Everts, MD  omeprazole (PRILOSEC) 20 MG capsule Take 1 capsule (20 mg total) by mouth daily. 09/14/19   Loura Halt A, NP  levocetirizine (XYZAL) 5 MG tablet Take 1 tablet (5 mg total) by mouth every evening. 05/05/19 09/14/19  Raylene Everts, MD    Family History Family History  Family history unknown: Yes    Social History Social History   Tobacco Use  . Smoking status: Never Smoker  . Smokeless tobacco: Never Used  Substance Use Topics  . Alcohol use: No  . Drug use: No     Allergies   Patient has no known allergies.   Review of Systems Review of Systems   Physical  Exam Triage Vital Signs ED Triage Vitals  Enc Vitals Group     BP 09/14/19 1548 (!) 136/99     Pulse Rate 09/14/19 1548 62     Resp 09/14/19 1548 16     Temp 09/14/19 1548 98.3 F (36.8 C)     Temp Source 09/14/19 1548 Oral     SpO2 09/14/19 1548 100 %     Weight 09/14/19 1549 150 lb (68 kg)     Height 09/14/19 1549 5\' 5"  (1.651 m)     Head Circumference --      Peak Flow --      Pain Score 09/14/19 1549 0     Pain Loc --      Pain Edu? --      Excl. in [place]? --    No data found.  Updated Vital Signs BP (!) 136/99   Pulse 62   Temp 98.3 F (36.8 C) (Oral)   Resp 16   Ht 5\' 5"  (1.651 m)   Wt 150 lb (68 kg)   SpO2 100%   BMI 24.96 kg/m   Visual Acuity Right Eye Distance:   Left Eye Distance:   Bilateral Distance:    Right Eye Near:   Left Eye Near:    Bilateral Near:     Physical Exam Vitals and nursing note  reviewed.  Constitutional:      General: He is not in acute distress.    Appearance: Normal appearance. He is not ill-appearing, toxic-appearing or diaphoretic.  HENT:     Head: Normocephalic and atraumatic.     Nose: Nose normal.     Mouth/Throat:     Mouth: Mucous membranes are moist.     Pharynx: Oropharynx is clear. No posterior oropharyngeal erythema.     Comments: Oral exam normal.   Eyes:     Conjunctiva/sclera: Conjunctivae normal.  Pulmonary:     Effort: Pulmonary effort is normal.  Abdominal:     Palpations: Abdomen is soft.     Tenderness: There is no abdominal tenderness.  Musculoskeletal:        General: Normal range of motion.     Cervical back: Normal range of motion.  Skin:    General: Skin is warm and dry.  Neurological:     Mental Status: He is alert.  Psychiatric:        Mood and Affect: Mood normal.      UC Treatments / Results  Labs (all labs ordered are listed, but only abnormal results are displayed) Labs Reviewed - No data to display  EKG   Radiology No results found.  Procedures Procedures (including  critical care time)  Medications Ordered in UC Medications - No data to display  Initial Impression / Assessment and Plan / UC Course  I have reviewed the triage vital signs and the nursing notes.  Pertinent labs & imaging results that were available during my care of the patient were reviewed by me and considered in my medical decision making (see chart for details).     Bad taste in mouth-most likely from postnasal drip from sinuses or acid reflux.  We will restart him on his allergy and PPI. Nothing concerning on exam.  Recommend follow-up with his primary care doctor for any continued or worsening problems Final Clinical Impressions(s) / UC Diagnoses   Final diagnoses:  Bad taste in mouth     Discharge Instructions     Take the allergy medicine daily as prescribed.  These bad taste in your mouth can be from mucous postnasal drip in the sinuses. You  can also start taking the omeprazole daily in case this is related to acid reflux. You want to avoid spicy, greasy foods or foods that can flare of acid reflux. Follow up as needed for continued or worsening symptoms     ED Prescriptions    Medication Sig Dispense Auth. Provider   cetirizine (ZYRTEC) 10 MG tablet Take 1 tablet (10 mg total) by mouth daily. 30 tablet Maura Braaten A, NP   omeprazole (PRILOSEC) 20 MG capsule Take 1 capsule (20 mg total) by mouth daily. 30 capsule Greer Wainright A, NP     PDMP not reviewed this encounter.   Dahlia Byes A, NP 09/15/19 [phone]

## 2019-09-22 ENCOUNTER — Ambulatory Visit (INDEPENDENT_AMBULATORY_CARE_PROVIDER_SITE_OTHER): Payer: BLUE CROSS/BLUE SHIELD | Admitting: Primary Care

## 2020-01-27 ENCOUNTER — Other Ambulatory Visit: Payer: Self-pay

## 2020-01-27 ENCOUNTER — Encounter (HOSPITAL_COMMUNITY): Payer: Self-pay | Admitting: Emergency Medicine

## 2020-01-27 ENCOUNTER — Ambulatory Visit (HOSPITAL_COMMUNITY)
Admission: EM | Admit: 2020-01-27 | Discharge: 2020-01-27 | Disposition: A | Discharge status: Home / Self Care | Payer: No Typology Code available for payment source | Attending: Family Medicine | Admitting: Family Medicine

## 2020-01-27 DIAGNOSIS — R1013 Epigastric pain: Secondary | ICD-10-CM | POA: Insufficient documentation

## 2020-01-27 DIAGNOSIS — N50819 Testicular pain, unspecified: Secondary | ICD-10-CM | POA: Insufficient documentation

## 2020-01-27 MED ORDER — OMEPRAZOLE 20 MG PO CPDR: 20.0000 mg | DELAYED_RELEASE_CAPSULE | Freq: Every day | ORAL | 11 refills | Status: DC

## 2020-01-27 NOTE — ED Triage Notes (Signed)
Patient reports a bitter taste in mouth for a month.  Patient reports coming here before, for the same complaint and was given medicine that helped

## 2020-01-27 NOTE — Discharge Instructions (Addendum)
We have sent testing for sexually transmitted infections. We will notify you of any positive results once they are received. If required, we will prescribe any medications you might need.  Please refrain from all sexual activity for at least the next seven days.  

## 2020-01-28 LAB — CYTOLOGY, (ORAL, ANAL, URETHRAL) ANCILLARY ONLY
Chlamydia: NEGATIVE
Comment: NEGATIVE
Comment: NEGATIVE
Comment: NORMAL
Neisseria Gonorrhea: NEGATIVE
Trichomonas: NEGATIVE

## 2020-01-28 NOTE — ED Provider Notes (Signed)
[facility]   [id number] 01/27/20 Arrival Time: 1749  ASSESSMENT & PLAN:  1. Dyspepsia   2. Testicular discomfort     Begin: Meds ordered this encounter  Medications  . omeprazole (PRILOSEC) 20 MG capsule    Sig: Take 1 capsule (20 mg total) by mouth daily.    Dispense:  30 capsule    Refill:  11    No symptoms of testicular torsion. He agrees to STD testing. Discussed typical duration of symptoms for suspected viral GI illness. Will do his best to ensure adequate fluid intake in order to avoid dehydration. Will proceed to the Emergency Department for evaluation if unable to tolerate PO fluids regularly.  Otherwise he will f/u with his PCP or here if not showing improvement over the next 48-72 hours.  Reviewed expectations re: course of current medical issues. Questions answered. Outlined signs and symptoms indicating need for more acute intervention. Patient verbalized understanding. After Visit Summary given.   SUBJECTIVE: History from: patient.  Martin Wright is a 44 y.o. male who reports a bitter taste in mouth. H/O same. Relieved when on acid reducing medication. Normal PO intake without n/v/d. Appetite normal. No abdominal pain.  Also reports testicular discomfort if he has not had sex within one week. None currently. Ejaculation relieves discomfort. No scrotal swelling. No trauma.    OBJECTIVE:  Vitals:   01/27/20 1847  BP: (!) 145/95  Pulse: 62  Resp: 18  Temp: 98.4 F (36.9 C)  TempSrc: Oral  SpO2: 98%    General appearance: alert; no distress Lungs: unlabored Abdomen: soft; non-distended; non-tender GU: deferred Back: no CVA tenderness Skin: warm; dry Psychological: alert and cooperative; normal mood and affect  Labs:  Labs Reviewed  CYTOLOGY, (ORAL, ANAL, URETHRAL) ANCILLARY ONLY     No Known Allergies                                             History reviewed. No pertinent past medical history. Social History    Socioeconomic History  . Marital status: Married    Spouse name: Not on file  . Number of children: Not on file  . Years of education: Not on file  . Highest education level: Not on file  Occupational History  . Not on file  Tobacco Use  . Smoking status: Never Smoker  . Smokeless tobacco: Never Used  Vaping Use  . Vaping Use: Never used  Substance and Sexual Activity  . Alcohol use: No  . Drug use: No  . Sexual activity: Yes    Partners: Female  Other Topics Concern  . Not on file  Social History Narrative  . Not on file   Social Determinants of Health   Financial Resource Strain:   . Difficulty of Paying Living Expenses: Not on file  Food Insecurity:   . Worried About Programme researcher, broadcasting/film/video in the Last Year: Not on file  . Ran Out of Food in the Last Year: Not on file  Transportation Needs:   . Lack of Transportation (Medical): Not on file  . Lack of Transportation (Non-Medical): Not on file  Physical Activity:   . Days of Exercise per Week: Not on file  . Minutes of Exercise per Session: Not on file  Stress:   . Feeling of Stress : Not on file  Social Connections:   . Frequency  of Communication with Friends and Family: Not on file  . Frequency of Social Gatherings with Friends and Family: Not on file  . Attends Religious Services: Not on file  . Active Member of Clubs or Organizations: Not on file  . Attends Banker Meetings: Not on file  . Marital Status: Not on file  Intimate Partner Violence:   . Fear of Current or Ex-Partner: Not on file  . Emotionally Abused: Not on file  . Physically Abused: Not on file  . Sexually Abused: Not on file   Family History  Family history unknown: Yes     Mardella Layman, MD 01/28/20 1135

## 2020-06-20 ENCOUNTER — Ambulatory Visit (HOSPITAL_COMMUNITY)
Admission: EM | Admit: 2020-06-20 | Discharge: 2020-06-20 | Disposition: A | Discharge status: Home / Self Care | Payer: 59 | Attending: Family Medicine | Admitting: Family Medicine

## 2020-06-20 ENCOUNTER — Other Ambulatory Visit: Payer: Self-pay

## 2020-06-20 ENCOUNTER — Encounter (HOSPITAL_COMMUNITY): Payer: Self-pay

## 2020-06-20 DIAGNOSIS — Z760 Encounter for issue of repeat prescription: Secondary | ICD-10-CM

## 2020-06-20 DIAGNOSIS — M542 Cervicalgia: Secondary | ICD-10-CM | POA: Diagnosis not present

## 2020-06-20 DIAGNOSIS — K219 Gastro-esophageal reflux disease without esophagitis: Secondary | ICD-10-CM | POA: Diagnosis not present

## 2020-06-20 MED ORDER — IBUPROFEN 800 MG PO TABS: 800.0000 mg | ORAL_TABLET | Freq: Three times a day (TID) | ORAL | 0 refills | Status: DC

## 2020-06-20 MED ORDER — TIZANIDINE HCL 4 MG PO TABS: 4.0000 mg | ORAL_TABLET | Freq: Four times a day (QID) | ORAL | 0 refills | Status: DC | PRN

## 2020-06-20 NOTE — ED Triage Notes (Signed)
Patient states he needs a refill of his Omeprazole 20mg  that is to be taken daily. Pt states he has been out of it for 2 months.

## 2020-06-20 NOTE — Discharge Instructions (Signed)
TAKE THE IBUPROFEN 3 X A DAY WITH FOOD - AS NEEDED MUSCLE PAIN TAKE THE MUSCLE RELAXER AT BEDTIME  OMEPRAZOLE IS REFILLED  RECOMMEND ESTABLISHING WITH PRIMARY CARE DOCTOR FOR ROUTINE MEDICINE REFILLS

## 2020-06-20 NOTE — ED Provider Notes (Signed)
[facility]    CSN: [id number] Arrival date & time: 06/20/20  8937      History   Chief Complaint Chief Complaint  Patient presents with  . Medication Refill    Omeprazole 20mg     HPI Martin Wright is a 44 y.o. male.   HPI   Patient is here with 2 medical complaints He states that he went to refill his omeprazole at the pharmacy and the pharmacist told him he should have a medical evaluation before staying on the medication The prescription was from August of this year and he has 11 refills, and he is only taking it as needed. He states that he takes it when he gets a bad taste in his mouth. He identifies this is acid reflux. He gets good relief from the omeprazole. No abdominal pain, no vomiting, no change in weight He also complains of pain in his right shoulder area. He points to the upper body of his trapezius. 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 He thinks that he has strained his muscle. No numbness or weakness into the arm. No headache. No history of neck problems. No primary care doctor at this time  History reviewed. No pertinent past medical history.  Patient Active Problem List   Diagnosis Date Noted  . GERD (gastroesophageal reflux disease) 06/21/2017  . Chronic viral hepatitis B without delta-agent ([facility]) 06/18/2017    History reviewed. No pertinent surgical history.     Home Medications    Prior to Admission medications   Medication Sig Start Date End Date Taking? Authorizing Provider  ibuprofen (ADVIL) 800 MG tablet Take 1 tablet (800 mg total) by mouth 3 (three) times daily. WITH FOOD 06/20/20  Yes 08/18/20, MD  omeprazole (PRILOSEC) 20 MG capsule Take 1 capsule (20 mg total) by mouth daily. 01/27/20  Yes Hagler, 01/29/20, MD  tiZANidine (ZANAFLEX) 4 MG tablet Take 1-2 tablets (4-8 mg total) by mouth every 6 (six) hours as needed for muscle  spasms. 06/20/20  Yes 08/18/20, MD  cetirizine (ZYRTEC) 10 MG tablet Take 1 tablet (10 mg total) by mouth daily. 09/14/19 01/27/20  01/29/20 A, NP  fluticasone (FLONASE) 50 MCG/ACT nasal spray Place 2 sprays into both nostrils daily. 05/05/19 01/27/20  01/29/20, MD  levocetirizine (XYZAL) 5 MG tablet Take 1 tablet (5 mg total) by mouth every evening. 05/05/19 09/14/19  11/14/19, MD    Family History Family History  Family history unknown: Yes    Social History Social History   Tobacco Use  . Smoking status: Never Smoker  . Smokeless tobacco: Never Used  Vaping Use  . Vaping Use: Never used  Substance Use Topics  . Alcohol use: No  . Drug use: No     Allergies   Patient has no known allergies.   Review of Systems Review of Systems See HPI  Physical Exam Triage Vital Signs ED Triage Vitals  Enc Vitals Group     BP 06/20/20 1122 (!) 146/90     Pulse Rate 06/20/20 1122 (!) 58     Resp 06/20/20 1122 18     Temp 06/20/20 1122 98.2 F (36.8 C)     Temp Source 06/20/20 1122 Oral     SpO2 06/20/20 1122 100 %     Weight --      Height --  Head Circumference --      Peak Flow --      Pain Score 06/20/20 1124 0     Pain Loc --      Pain Edu? --      Excl. in [place]? --    No data found.  Updated Vital Signs BP (!) 146/90 (BP Location: Right Arm)   Pulse (!) 58   Temp 98.2 F (36.8 C) (Oral)   Resp 18   SpO2 100%     Physical Exam Constitutional:      General: He is not in acute distress.    Appearance: He is well-developed, normal weight and well-nourished. He is not ill-appearing.  HENT:     Head: Normocephalic and atraumatic.     Mouth/Throat:     Mouth: Oropharynx is clear and moist.     Comments: Mask is in place Eyes:     Conjunctiva/sclera: Conjunctivae normal.     Pupils: Pupils are equal, round, and reactive to light.  Neck:   Cardiovascular:     Rate and Rhythm: Normal rate.  Pulmonary:     Effort: Pulmonary effort  is normal. No respiratory distress.  Abdominal:     General: There is no distension.     Palpations: Abdomen is soft.  Musculoskeletal:        General: No edema. Normal range of motion.     Cervical back: Normal range of motion. Muscular tenderness present. Normal range of motion.  Skin:    General: Skin is warm and dry.  Neurological:     General: No focal deficit present.     Mental Status: He is alert.     Sensory: No sensory deficit.     Coordination: Coordination normal.     Deep Tendon Reflexes: Reflexes normal.  Psychiatric:        Behavior: Behavior normal.      UC Treatments / Results  Labs (all labs ordered are listed, but only abnormal results are displayed) Labs Reviewed - No data to display  EKG   Radiology No results found.  Procedures Procedures (including critical care time)  Medications Ordered in UC Medications - No data to display  Initial Impression / Assessment and Plan / UC Course  I have reviewed the triage vital signs and the nursing notes.  Pertinent labs & imaging results that were available during my care of the patient were reviewed by me and considered in my medical decision making (see chart for details).    The importance of primary care is reviewed Omeprazole is refilled We will use ibuprofen for the neck pain with nighttime use of tizanidine Final Clinical Impressions(s) / UC Diagnoses   Final diagnoses:  Gastroesophageal reflux disease without esophagitis  Musculoskeletal neck pain  Encounter for medication refill     Discharge Instructions     TAKE THE IBUPROFEN 3 X A DAY WITH FOOD - AS NEEDED MUSCLE PAIN TAKE THE MUSCLE RELAXER AT BEDTIME  OMEPRAZOLE IS REFILLED  RECOMMEND ESTABLISHING WITH PRIMARY CARE DOCTOR FOR ROUTINE MEDICINE REFILLS   ED Prescriptions    Medication Sig Dispense Auth. Provider   ibuprofen (ADVIL) 800 MG tablet Take 1 tablet (800 mg total) by mouth 3 (three) times daily. WITH FOOD 21 tablet  Eustace Moore, MD   tiZANidine (ZANAFLEX) 4 MG tablet Take 1-2 tablets (4-8 mg total) by mouth every 6 (six) hours as needed for muscle spasms. 21 tablet Eustace Moore, MD     PDMP not reviewed  this encounter.   Eustace Moore, MD 06/20/20 1250

## 2020-12-19 ENCOUNTER — Encounter (INDEPENDENT_AMBULATORY_CARE_PROVIDER_SITE_OTHER): Payer: Self-pay | Admitting: Primary Care

## 2020-12-19 ENCOUNTER — Ambulatory Visit (INDEPENDENT_AMBULATORY_CARE_PROVIDER_SITE_OTHER): Payer: 59 | Admitting: Primary Care

## 2020-12-19 ENCOUNTER — Other Ambulatory Visit: Payer: Self-pay

## 2020-12-19 VITALS — BP 137/93 | HR 67 | Temp 97.5°F | Ht 65.0 in | Wt 147.8 lb

## 2020-12-19 DIAGNOSIS — G4700 Insomnia, unspecified: Secondary | ICD-10-CM | POA: Diagnosis not present

## 2020-12-19 DIAGNOSIS — R030 Elevated blood-pressure reading, without diagnosis of hypertension: Secondary | ICD-10-CM | POA: Diagnosis not present

## 2020-12-19 DIAGNOSIS — Z7689 Persons encountering health services in other specified circumstances: Secondary | ICD-10-CM | POA: Diagnosis not present

## 2020-12-19 NOTE — Patient Instructions (Addendum)
Any over the counter vitamins ( multivitamins for age group) Insomnia Insomnia is frequent trouble falling and/or staying asleep. Insomnia can be a long term problem or a short term problem. Both are common. Insomnia can be a short term problem when the wakefulness is related to a certain stress or worry. Long term insomnia is often related to ongoing stress during waking hours and/or poor sleeping habits. Overtime, sleep deprivation itself can make the problem worse. Every little thing feels more severe because you are overtired and your ability to cope is decreased. CAUSES  Stress, anxiety, and depression. Poor sleeping habits. Distractions such as TV in the bedroom. Naps close to bedtime. Engaging in emotionally charged conversations before bed. Technical reading before sleep. Alcohol and other sedatives. They may make the problem worse. They can hurt normal sleep patterns and normal dream activity. Stimulants such as caffeine for several hours prior to bedtime. Pain syndromes and shortness of breath can cause insomnia. Exercise late at night. Changing time zones may cause sleeping problems (jet lag). It is sometimes helpful to have someone observe your sleeping patterns. They should look for periods of not breathing during the night (sleep apnea). They should also look to see how long those periods last. If you live alone or observers are uncertain, you can also be observed at a sleep clinic where your sleep patterns will be professionally monitored. Sleep apnea requires a checkup and treatment. Give your caregivers your medical history. Give your caregivers observations your family has made about your sleep.  SYMPTOMS  Not feeling rested in the morning. Anxiety and restlessness at bedtime. Difficulty falling and staying asleep. TREATMENT  Your caregiver may prescribe treatment for an underlying medical disorders. Your caregiver can give advice or help if you are using alcohol or other drugs  for self-medication. Treatment of underlying problems will usually eliminate insomnia problems. Medications can be prescribed for short time use. They are generally not recommended for lengthy use. Over-the-counter sleep medicines are not recommended for lengthy use. They can be habit forming. You can promote easier sleeping by making lifestyle changes such as: Using relaxation techniques that help with breathing and reduce muscle tension. Exercising earlier in the day. Changing your diet and the time of your last meal. No night time snacks. Establish a regular time to go to bed. Counseling can help with stressful problems and worry. Soothing music and white noise may be helpful if there are background noises you cannot remove. Stop tedious detailed work at least one hour before bedtime. HOME CARE INSTRUCTIONS  Keep a diary. Inform your caregiver about your progress. This includes any medication side effects. See your caregiver regularly. Take note of: Times when you are asleep. Times when you are awake during the night. The quality of your sleep. How you feel the next day. This information will help your caregiver care for you. Get out of bed if you are still awake after 15 minutes. Read or do some quiet activity. Keep the lights down. Wait until you feel sleepy and go back to bed. Keep regular sleeping and waking hours. Avoid naps. Exercise regularly. Avoid distractions at bedtime. Distractions include watching television or engaging in any intense or detailed activity like attempting to balance the household checkbook. Develop a bedtime ritual. Keep a familiar routine of bathing, brushing your teeth, climbing into bed at the same time each night, listening to soothing music. Routines increase the success of falling to sleep faster. Use relaxation techniques. This can be using breathing and  muscle tension release routines. It can also include visualizing peaceful scenes. You can also help  control troubling or intruding thoughts by keeping your mind occupied with boring or repetitive thoughts like the old concept of counting sheep. You can make it more creative like imagining planting one beautiful flower after another in your backyard garden. During your day, work to eliminate stress. When this is not possible use some of the previous suggestions to help reduce the anxiety that accompanies stressful situations. MAKE SURE YOU:  Understand these instructions. Will watch your condition. Will get help right away if you are not doing well or get worse. Document Released: 05/25/2000 Document Revised: 08/20/2011 Document Reviewed: 06/25/2007 [facility] Patient Information 2015 [place], [place]. This information is not intended to replace advice given to you by your health care provider. Make sure you discuss any questions you have with your health care provider.

## 2020-12-19 NOTE — Progress Notes (Signed)
New Patient Office Visit  Subjective:  Patient ID: Zaydrian Batta, male    DOB: [birth year]  Age: 45 y.o. MRN: [id number]  CC:  Chief Complaint  Patient presents with   New Patient (Initial Visit)    HPI Mr. Hines Kloss is a 44 year old male who presents for establishment of care and voices concerns with insomnia and pressure be hind eyes.    History reviewed. No pertinent past medical history.  History reviewed. No pertinent surgical history.  Family History  Family history unknown: Yes    Social History   Socioeconomic History   Marital status: Married    Spouse name: Not on file   Number of children: Not on file   Years of education: Not on file   Highest education level: Not on file  Occupational History   Not on file  Tobacco Use   Smoking status: Never   Smokeless tobacco: Never  Vaping Use   Vaping Use: Never used  Substance and Sexual Activity   Alcohol use: No   Drug use: No   Sexual activity: Yes    Partners: Female  Other Topics Concern   Not on file  Social History Narrative   Not on file   Social Determinants of Health   Financial Resource Strain: Not on file  Food Insecurity: Not on file  Transportation Needs: Not on file  Physical Activity: Not on file  Stress: Not on file  Social Connections: Not on file  Intimate Partner Violence: Not on file    ROS Review of Systems  Eyes:        Pressure behind eyes  Neurological:  Positive for light-headedness.       Resolves as the day goes on if he wakes up with H/A  Psychiatric/Behavioral:  Positive for sleep disturbance.   All other systems reviewed and are negative.  Objective:   Today's Vitals: BP (!) 137/93 (BP Location: Right Arm, Patient Position: Sitting, Cuff Size: Normal)   Pulse 67   Temp (!) 97.5 F (36.4 C) (Temporal)   Ht 5\' 5"  (1.651 m)   Wt 147 lb 12.8 oz (67 kg)   SpO2 94%   BMI 24.60 kg/m   Physical Exam Vitals reviewed.  Constitutional:       Appearance: Normal appearance.  HENT:     Head: Normocephalic.     Right Ear: External ear normal.     Left Ear: Tympanic membrane and external ear normal.     Nose: Nose normal.  Cardiovascular:     Rate and Rhythm: Normal rate and regular rhythm.  Pulmonary:     Effort: Pulmonary effort is normal.     Breath sounds: Normal breath sounds.  Abdominal:     General: Bowel sounds are normal.  Musculoskeletal:        General: Normal range of motion.     Cervical back: Normal range of motion.  Skin:    General: Skin is warm and dry.  Neurological:     Mental Status: He is alert and oriented to person, place, and time.  Psychiatric:        Mood and Affect: Mood normal.        Behavior: Behavior normal.        Thought Content: Thought content normal.        Judgment: Judgment normal.   Assessment & Plan:   Problem List Items Addressed This Visit   None Visit Diagnoses     Encounter  to establish care    -  Primary   Insomnia, unspecified type       Elevated blood pressure reading           Outpatient Encounter Medications as of 12/19/2020  Medication Sig   omeprazole (PRILOSEC) 20 MG capsule Take 1 capsule (20 mg total) by mouth daily.   [DISCONTINUED] cetirizine (ZYRTEC) 10 MG tablet Take 1 tablet (10 mg total) by mouth daily.   [DISCONTINUED] fluticasone (FLONASE) 50 MCG/ACT nasal spray Place 2 sprays into both nostrils daily.   [DISCONTINUED] ibuprofen (ADVIL) 800 MG tablet Take 1 tablet (800 mg total) by mouth 3 (three) times daily. WITH FOOD   [DISCONTINUED] levocetirizine (XYZAL) 5 MG tablet Take 1 tablet (5 mg total) by mouth every evening.   [DISCONTINUED] tiZANidine (ZANAFLEX) 4 MG tablet Take 1-2 tablets (4-8 mg total) by mouth every 6 (six) hours as needed for muscle spasms.   No facility-administered encounter medications on file as of 12/19/2020.   Kijana was seen today for new patient (initial visit).  Diagnoses and all orders for this visit:  Encounter to  establish care Establish care with new PCP  Insomnia, unspecified type Can try melatonin 5mg -15 mg at night for sleep, can also do benadryl 25-50mg  at night for sleep.  If this does not help we can try prescription medication.  Also here is some information about good sleep hygiene.   Elevated blood pressure reading Counseled on blood pressure goal of less than 130/80, low-sodium, DASH diet, medication compliance, 150 minutes of moderate intensity exercise per week.   Follow-up: Return in about 2 months (around 02/19/2021) for Bp fasting labs .   04/21/2021, NP

## 2020-12-19 NOTE — Progress Notes (Signed)
Established Patient Office Visit  Subjective:  Patient ID: Martin Wright, male    DOB: Oct 25, 1975  Age: 45 y.o. MRN: [id number]  CC:  Chief Complaint  Patient presents with  . New Patient (Initial Visit)    HPI Martin Wright presents for establishment of care.  Blood pressure is elevated at this visit.Denies shortness of breath, headaches, chest pain or lower extremity edema.Pt complains of not being able to sleep and about pressure in his eyes   No past surgical history on file.  Family History  Family history unknown: Yes    Social History   Socioeconomic History  . Marital status: Married    Spouse name: Not on file  . Number of children: Not on file  . Years of education: Not on file  . Highest education level: Not on file  Occupational History  . Not on file  Tobacco Use  . Smoking status: Never  . Smokeless tobacco: Never  Vaping Use  . Vaping Use: Never used  Substance and Sexual Activity  . Alcohol use: No  . Drug use: No  . Sexual activity: Yes    Partners: Female  Other Topics Concern  . Not on file  Social History Narrative  . Not on file   Social Determinants of Health   Financial Resource Strain: Not on file  Food Insecurity: Not on file  Transportation Needs: Not on file  Physical Activity: Not on file  Stress: Not on file  Social Connections: Not on file  Intimate Partner Violence: Not on file    Outpatient Medications Prior to Visit  Medication Sig Dispense Refill  . omeprazole (PRILOSEC) 20 MG capsule Take 1 capsule (20 mg total) by mouth daily. 30 capsule 11  . ibuprofen (ADVIL) 800 MG tablet Take 1 tablet (800 mg total) by mouth 3 (three) times daily. WITH FOOD 21 tablet 0  . tiZANidine (ZANAFLEX) 4 MG tablet Take 1-2 tablets (4-8 mg total) by mouth every 6 (six) hours as needed for muscle spasms. 21 tablet 0   No facility-administered medications prior to visit.    No Known Allergies  ROS Review of Systems  HENT:          Pressure behind his eyes  Psychiatric/Behavioral:  Positive for sleep disturbance.   All other systems reviewed and are negative.    Objective:    Physical Exam  BP (!) 137/93 (BP Location: Right Arm, Patient Position: Sitting, Cuff Size: Normal)   Pulse 67   Temp (!) 97.5 F (36.4 C) (Temporal)   Ht 5\' 5"  (1.651 m)   Wt 147 lb 12.8 oz (67 kg)   SpO2 94%   BMI 24.60 kg/m  Wt Readings from Last 3 Encounters:  12/19/20 147 lb 12.8 oz (67 kg)  09/14/19 150 lb (68 kg)  08/26/18 125 lb 12.8 oz (57.1 kg)     Health Maintenance Due  Topic Date Due  . COVID-19 Vaccine (1) Never done  . COLONOSCOPY (Pts 45-45yrs Insurance coverage will need to be confirmed)  Never done    There are no preventive care reminders to display for this patient.  Lab Results  Component Value Date   TSH 3.440 05/07/2017   Lab Results  Component Value Date   WBC 6.9 05/07/2017   HGB 16.9 05/07/2017   HCT 49.3 05/07/2017   MCV 87 05/07/2017   PLT 214 05/07/2017   Lab Results  Component Value Date   NA 139 05/07/2017   K 4.9  05/07/2017   CO2 23 05/07/2017   GLUCOSE 79 05/07/2017   BUN 15 05/07/2017   CREATININE 1.25 05/07/2017   BILITOT 1.1 05/07/2017   ALKPHOS 58 05/07/2017   AST 21 05/07/2017   ALT 15 06/18/2017   PROT 8.3 05/07/2017   ALBUMIN 5.3 05/07/2017   CALCIUM 10.3 (H) 05/07/2017   No results found for: CHOL No results found for: HDL No results found for: LDLCALC No results found for: TRIG No results found for: CHOLHDL No results found for: JZPH1T    Assessment & Plan:   Problem List Items Addressed This Visit   None   No orders of the defined types were placed in this encounter.   Follow-up: No follow-ups on file.    Grayce Sessions, NP

## 2021-01-12 ENCOUNTER — Emergency Department (HOSPITAL_COMMUNITY)
Admission: EM | Admit: 2021-01-12 | Discharge: 2021-01-12 | Disposition: A | Discharge status: Home / Self Care | Payer: 59 | Attending: Emergency Medicine | Admitting: Emergency Medicine

## 2021-01-12 ENCOUNTER — Encounter (HOSPITAL_COMMUNITY): Payer: Self-pay

## 2021-01-12 ENCOUNTER — Ambulatory Visit (HOSPITAL_COMMUNITY)
Admission: EM | Admit: 2021-01-12 | Discharge: 2021-01-12 | Disposition: A | Discharge status: Home / Self Care | Payer: 59 | Attending: Internal Medicine | Admitting: Internal Medicine

## 2021-01-12 ENCOUNTER — Emergency Department (HOSPITAL_COMMUNITY): Payer: 59

## 2021-01-12 ENCOUNTER — Other Ambulatory Visit: Payer: Self-pay

## 2021-01-12 DIAGNOSIS — R0789 Other chest pain: Secondary | ICD-10-CM

## 2021-01-12 DIAGNOSIS — R072 Precordial pain: Secondary | ICD-10-CM

## 2021-01-12 DIAGNOSIS — U071 COVID-19: Secondary | ICD-10-CM | POA: Diagnosis not present

## 2021-01-12 DIAGNOSIS — R52 Pain, unspecified: Secondary | ICD-10-CM

## 2021-01-12 DIAGNOSIS — R110 Nausea: Secondary | ICD-10-CM | POA: Diagnosis not present

## 2021-01-12 DIAGNOSIS — J069 Acute upper respiratory infection, unspecified: Secondary | ICD-10-CM

## 2021-01-12 DIAGNOSIS — R42 Dizziness and giddiness: Secondary | ICD-10-CM | POA: Diagnosis not present

## 2021-01-12 DIAGNOSIS — R5383 Other fatigue: Secondary | ICD-10-CM

## 2021-01-12 LAB — COMPREHENSIVE METABOLIC PANEL
ALT: 18 U/L (ref 0–44)
AST: 24 U/L (ref 15–41)
Albumin: 3.8 g/dL (ref 3.5–5.0)
Alkaline Phosphatase: 55 U/L (ref 38–126)
Anion gap: 7 (ref 5–15)
BUN: 10 mg/dL (ref 6–20)
CO2: 26 mmol/L (ref 22–32)
Calcium: 9 mg/dL (ref 8.9–10.3)
Chloride: 103 mmol/L (ref 98–111)
Creatinine, Ser: 1.43 mg/dL — ABNORMAL HIGH (ref 0.61–1.24)
GFR, Estimated: >60 mL/min (ref >60)
Glucose, Bld: 95 mg/dL (ref 70–99)
Potassium: 3.8 mmol/L (ref 3.5–5.1)
Sodium: 136 mmol/L (ref 135–145)
Total Bilirubin: 1.1 mg/dL (ref 0.3–1.2)
Total Protein: 6.8 g/dL (ref 6.5–8.1)

## 2021-01-12 LAB — SARS CORONAVIRUS 2 (TAT 6-24 HRS): SARS Coronavirus 2: POSITIVE — AB

## 2021-01-12 LAB — CBC WITH DIFFERENTIAL/PLATELET
Abs Immature Granulocytes: 0.01 10*3/uL (ref 0.00–0.07)
Basophils Absolute: 0 10*3/uL (ref 0.0–0.1)
Basophils Relative: 0 %
Eosinophils Absolute: 0 10*3/uL (ref 0.0–0.5)
Eosinophils Relative: 1 %
HCT: 52 % (ref 39.0–52.0)
Hemoglobin: 16.8 g/dL (ref 13.0–17.0)
Immature Granulocytes: 0 %
Lymphocytes Relative: 31 %
Lymphs Abs: 1.5 10*3/uL (ref 0.7–4.0)
MCH: 29 pg (ref 26.0–34.0)
MCHC: 32.3 g/dL (ref 30.0–36.0)
MCV: 89.8 fL (ref 80.0–100.0)
Monocytes Absolute: 1.3 10*3/uL — ABNORMAL HIGH (ref 0.1–1.0)
Monocytes Relative: 26 %
Neutro Abs: 2.1 10*3/uL (ref 1.7–7.7)
Neutrophils Relative %: 42 %
Platelets: 168 10*3/uL (ref 150–400)
RBC: 5.79 MIL/uL (ref 4.22–5.81)
RDW: 11.8 % (ref 11.5–15.5)
WBC: 4.9 10*3/uL (ref 4.0–10.5)
nRBC: 0 % (ref 0.0–0.2)

## 2021-01-12 LAB — TROPONIN I (HIGH SENSITIVITY)
Troponin I (High Sensitivity): 5 ng/L (ref <18)
Troponin I (High Sensitivity): 6 ng/L (ref <18)

## 2021-01-12 LAB — D-DIMER, QUANTITATIVE: D-Dimer, Quant: 1.5 ug/mL-FEU — ABNORMAL HIGH (ref 0.00–0.50)

## 2021-01-12 MED ORDER — IOHEXOL 350 MG/ML SOLN
70.0000 mL | Freq: Once | INTRAVENOUS | Status: AC | PRN
  Administered 2021-01-12: 70 mL via INTRAVENOUS

## 2021-01-12 MED ORDER — NITROGLYCERIN 0.4 MG SL SUBL
SUBLINGUAL_TABLET | SUBLINGUAL | Status: AC
  Filled 2021-01-12: qty 1

## 2021-01-12 MED ORDER — ASPIRIN 81 MG PO CHEW
CHEWABLE_TABLET | ORAL | Status: AC
  Filled 2021-01-12: qty 4

## 2021-01-12 MED ORDER — DIPHENHYDRAMINE HCL 50 MG/ML IJ SOLN
25.0000 mg | Freq: Once | INTRAMUSCULAR | Status: AC
  Administered 2021-01-12: 25 mg via INTRAVENOUS
  Filled 2021-01-12: qty 1

## 2021-01-12 MED ORDER — NITROGLYCERIN 0.4 MG SL SUBL
0.4000 mg | SUBLINGUAL_TABLET | Freq: Once | SUBLINGUAL | Status: AC
  Administered 2021-01-12: 0.4 mg via SUBLINGUAL

## 2021-01-12 MED ORDER — PROCHLORPERAZINE EDISYLATE 10 MG/2ML IJ SOLN
10.0000 mg | Freq: Once | INTRAMUSCULAR | Status: AC
  Administered 2021-01-12: 10 mg via INTRAVENOUS
  Filled 2021-01-12: qty 2

## 2021-01-12 MED ORDER — ASPIRIN 81 MG PO CHEW
324.0000 mg | CHEWABLE_TABLET | ORAL | Status: AC
  Administered 2021-01-12: 324 mg via ORAL

## 2021-01-12 NOTE — ED Triage Notes (Signed)
Pt c/o headaches, body aches, chest pain. Chest pain no radiating, pt not table to describe pain. Denies changes in vision.

## 2021-01-12 NOTE — ED Notes (Signed)
Pt called staff to room stating he feels weak, requesting food/drink. MD messaged regarding diet orders.

## 2021-01-12 NOTE — Discharge Instructions (Addendum)
Patient was sent to the hospital via EMS.  

## 2021-01-12 NOTE — ED Provider Notes (Signed)
Patient's troponins x2 with no significant change.  CT scan of the abdomen negative for pulmonary embolus but does show a 4.1 cm ascending thoracic aneurysm.  Patient has been chest pain-free for hours now.  Patient stable for discharge home follow-up with cardiothoracic surgery for the aneurysm follow-up with primary care doctor follow-up with cardiology and recommend starting a baby aspirin a day.  Patient will return for any new or worse symptoms.  COVID testing was also done.  It was the 6 to 24-hour test.  So that is not back yet.  Patient will be notified if that is positive.  Patient can also followed up on MyChart.   Vanetta Mulders, MD 01/12/21 2200

## 2021-01-12 NOTE — ED Notes (Signed)
Patient is being discharged from the Urgent Care and sent to the Emergency Department via [facility] . Per H. Ether Griffins FNP, patient is in need of higher level of care due to changes to EKG and chest pain. Patient is aware and verbalizes understanding of plan of care.  Vitals:   01/12/21 1234 01/12/21 1328  BP: (!) 147/86 (!) 142/95  Pulse: 74 71  Resp: 18 (!) 22  Temp: 99.4 F (37.4 C) 99.4 F (37.4 C)  SpO2: 98% 98%

## 2021-01-12 NOTE — Discharge Instructions (Addendum)
Work-up for the chest pain without any acute findings.  But recommend following up with cardiology call and make an appointment start a baby aspirin a day.  For the fatigue your COVID testing should be back tomorrow will be notified if it is positive or you can check in on MyChart.  Also the CAT scan of your chest showed a aneurysm in your big vessel in your chest that will need some follow-up with cardiothoracic surgery.  Make an appointment to follow-up with them they will follow this on a routine basis.  Also make an appointment to follow-up with your regular doctor.  Return for any new or worse symptoms.

## 2021-01-12 NOTE — ED Triage Notes (Signed)
Sent by UC for eval of chest pain. EMS reports possible STEMI. 324mg  Aspirin and 2 SL nitro given with relief. Alert and oriented x 4. Reports chest pain x 2 days with weakness.

## 2021-01-12 NOTE — ED Notes (Signed)
EKG given to provider 

## 2021-01-12 NOTE — ED Provider Notes (Signed)
[facility] EMERGENCY DEPARTMENT Provider Note   CSN: [id number] Arrival date & time: 01/12/21  1340     History Chief Complaint  Patient presents with   Chest Pain    Martin Wright is a 45 y.o. male.  HPI     45 year old male with history of chronic viral hepatitis B, GERD, presented concern for chest pain from the urgent care.  Reports that for the last 2 days, he is experienced fatigue, lightheadedness and headache, and today he developed chest pain that brought him to the urgent care.  Describes the chest pain as a pinching in the left side of his chest and across his chest without other radiation.  Reports that when the pain was most severe this morning, he did have associated dyspnea, but denies shortness of breath at this time. Has had some nausea .  No vomiting, abdominal pain, fever, cough, leg pain or swelling.  Feels the pain is worse laying down, and better when he is up walking around.  Does not smoke cigarettes, drink alcohol or use other drugs.  No family history of early heart disease.  No history of long trips in a car airplane, recent surgery or immobilization.  History reviewed. No pertinent past medical history.  Patient Active Problem List   Diagnosis Date Noted   GERD (gastroesophageal reflux disease) 06/21/2017   Chronic viral hepatitis B without delta-agent (HCC) 06/18/2017    History reviewed. No pertinent surgical history.     Family History  Family history unknown: Yes    Social History   Tobacco Use   Smoking status: Never   Smokeless tobacco: Never  Vaping Use   Vaping Use: Never used  Substance Use Topics   Alcohol use: No   Drug use: No    Home Medications Prior to Admission medications   Medication Sig Start Date End Date Taking? Authorizing Provider  omeprazole (PRILOSEC) 20 MG capsule Take 1 capsule (20 mg total) by mouth daily. Patient not taking: Reported on 01/12/2021 01/27/20   Mardella Layman, MD   cetirizine (ZYRTEC) 10 MG tablet Take 1 tablet (10 mg total) by mouth daily. 09/14/19 01/27/20  Dahlia Byes A, NP  fluticasone (FLONASE) 50 MCG/ACT nasal spray Place 2 sprays into both nostrils daily. 05/05/19 01/27/20  Eustace Moore, MD  levocetirizine (XYZAL) 5 MG tablet Take 1 tablet (5 mg total) by mouth every evening. 05/05/19 09/14/19  Eustace Moore, MD    Allergies    Patient has no known allergies.  Review of Systems   Review of Systems  Constitutional:  Positive for fatigue. Negative for fever.  HENT:  Negative for sore throat.   Eyes:  Negative for visual disturbance.  Respiratory:  Positive for shortness of breath.   Cardiovascular:  Positive for chest pain.  Gastrointestinal:  Positive for nausea. Negative for abdominal pain, diarrhea and vomiting.  Genitourinary:  Negative for difficulty urinating.  Musculoskeletal:  Negative for back pain and neck stiffness.  Skin:  Negative for rash.  Neurological:  Positive for light-headedness and headaches. Negative for syncope.   Physical Exam Updated Vital Signs BP (!) 115/98   Pulse 83   Temp 99.5 F (37.5 C)   Resp 19   Ht 5\' 5"  (1.651 m)   Wt 67 kg   SpO2 96%   BMI 24.58 kg/m   Physical Exam Vitals and nursing note reviewed.  Constitutional:      General: He is not in acute distress.  Appearance: Normal appearance. He is well-developed. He is not ill-appearing or diaphoretic.  HENT:     Head: Normocephalic and atraumatic.  Eyes:     General: No visual field deficit.    Extraocular Movements: Extraocular movements intact.     Conjunctiva/sclera: Conjunctivae normal.     Pupils: Pupils are equal, round, and reactive to light.  Cardiovascular:     Rate and Rhythm: Normal rate and regular rhythm.     Pulses: Normal pulses.     Heart sounds: Normal heart sounds. No murmur heard.   No friction rub. No gallop.  Pulmonary:     Effort: Pulmonary effort is normal. No respiratory distress.     Breath sounds:  Normal breath sounds. No wheezing or rales.  Abdominal:     General: There is no distension.     Palpations: Abdomen is soft.     Tenderness: There is no abdominal tenderness. There is no guarding.  Musculoskeletal:        General: No swelling or tenderness.     Cervical back: Normal range of motion.  Skin:    General: Skin is warm and dry.     Findings: No erythema or rash.  Neurological:     General: No focal deficit present.     Mental Status: He is alert and oriented to person, place, and time.     GCS: GCS eye subscore is 4. GCS verbal subscore is 5. GCS motor subscore is 6.     Cranial Nerves: No cranial nerve deficit, dysarthria or facial asymmetry.     Sensory: No sensory deficit.     Motor: No weakness or tremor.     Coordination: Coordination normal. Finger-Nose-Finger Test normal.     Gait: Gait normal.    ED Results / Procedures / Treatments   Labs (all labs ordered are listed, but only abnormal results are displayed) Labs Reviewed  SARS CORONAVIRUS 2 (TAT 6-24 HRS) - Abnormal; Notable for the following components:      Result Value   SARS Coronavirus 2 POSITIVE (*)    All other components within normal limits  CBC WITH DIFFERENTIAL/PLATELET - Abnormal; Notable for the following components:   Monocytes Absolute 1.3 (*)    All other components within normal limits  D-DIMER, QUANTITATIVE - Abnormal; Notable for the following components:   D-Dimer, Quant 1.50 (*)    All other components within normal limits  COMPREHENSIVE METABOLIC PANEL - Abnormal; Notable for the following components:   Creatinine, Ser 1.43 (*)    All other components within normal limits  TROPONIN I (HIGH SENSITIVITY)  TROPONIN I (HIGH SENSITIVITY)    EKG EKG Interpretation  Date/Time:  Thursday January 12 2021 13:42:46 EDT Ventricular Rate:  81 PR Interval:  143 QRS Duration: 92 QT Interval:  350 QTC Calculation: 407 R Axis:   -59 Text Interpretation: Sinus rhythm LAD, consider left  anterior fascicular block Nonspecific T abnormalities, lateral leads Since prior ECG< decreased elevation anterior leads, decreased inferior ST depression Confirmed by Alvira Monday ([id number]) on 01/12/2021 1:56:39 PM  Radiology CT Angio Chest PE W and/or Wo Contrast  Result Date: 01/12/2021 CLINICAL DATA:  PE suspected, high prob EXAM: CT ANGIOGRAPHY CHEST WITH CONTRAST TECHNIQUE: Multidetector CT imaging of the chest was performed using the standard protocol during bolus administration of intravenous contrast. Multiplanar CT image reconstructions and MIPs were obtained to evaluate the vascular anatomy. CONTRAST:  66mL OMNIPAQUE IOHEXOL 350 MG/ML SOLN COMPARISON:  None. FINDINGS: Cardiovascular: Satisfactory opacification of the  pulmonary arteries to the segmental level. No evidence of pulmonary embolism. Borderline enlarged heart. No pericardial effusion. The ascending aorta measures 4.0 cm. Mediastinum/Nodes: No enlarged mediastinal, hilar, or axillary lymph nodes. Thyroid gland, trachea, and esophagus demonstrate no significant findings. Lungs/Pleura: No focal airspace consolidation. Calcified granuloma in the anterior aspect of the right lower lobe. Upper Abdomen: Calcified hepatic granulomas.  No acute abnormality. Musculoskeletal: No chest wall abnormality. No acute or significant osseous findings. Review of the MIP images confirms the above findings. IMPRESSION: No pulmonary embolism or other acute findings in the chest. Mildly dilated ascending aorta measuring 4.1 cm. Recommend semi-annual imaging followup by CTA or MRA and referral to cardiothoracic surgery if not already obtained. This recommendation follows 2010 ACCF/AHA/AATS/ACR/ASA/SCA/SCAI/SIR/STS/SVM Guidelines for the Diagnosis and Management of Patients With Thoracic Aortic Disease. Circulation. 2010; 121: B716-R67. Aortic aneurysm NOS (ICD10-I71.9) Electronically Signed   By: Caprice Renshaw   On: 01/12/2021 20:17    Procedures Procedures    Medications Ordered in ED Medications  prochlorperazine (COMPAZINE) injection 10 mg (10 mg Intravenous Given 01/12/21 1357)  diphenhydrAMINE (BENADRYL) injection 25 mg (25 mg Intravenous Given 01/12/21 1358)  iohexol (OMNIPAQUE) 350 MG/ML injection 70 mL (70 mLs Intravenous Contrast Given 01/12/21 1941)    ED Course  I have reviewed the triage vital signs and the nursing notes.  Pertinent labs & imaging results that were available during my care of the patient were reviewed by me and considered in my medical decision making (see chart for details).    MDM Rules/Calculators/A&P                            45 year old male with history of chronic viral hepatitis B, GERD, presented concern for chest pain from the urgent care.  Initial EKG obtained at urgent care shows elevation of V2, V3, and diffuse depressions, however may be related to left ventricular hypertrophy, and repeat EKG on arrival to the emergency department shows improvement with EKG not meeting criteria for STEMI at this time. It appears Cardiology, Dr. Elberta Fortis read initial ECG from urgent care.  CBC without anemia, no leukocytosis.  DDimer positive and CT PE ordered and pending. CMP, troponin x2 ordered and pending at time of transfer of care to Dr. Deretha Emory.  Had headache, slow onset, no fever, doubt SAH, meningitis. Neuro eam WNL.  HA improved with headache cocktail.      Final Clinical Impression(s) / ED Diagnoses Final diagnoses:  Precordial pain  Fatigue, unspecified type    Rx / DC Orders ED Discharge Orders     None        Alvira Monday, MD 01/12/21 2307

## 2021-01-12 NOTE — ED Provider Notes (Signed)
[facility]    CSN: [id number] Arrival date & time: 01/12/21  1105      History   Chief Complaint Chief Complaint  Patient presents with   Chest Pain    HPI Martin Wright is a 45 y.o. male.   Patient presents to urgent care for 2-day history of headaches, generalized body aches, chest pain.  Chest pain is described as "something feels like it is pulling his chest apart".  Chest pain does not radiate down his left arm but he does have body aches throughout his entire body from head to toe.  Denies any cardiac history.  Only pertinent medical history is acid reflux where he takes omeprazole 20 mg daily.  Denies any known fevers or upper respiratory symptoms but states that he has had some chills.  Denies any shortness of breath, blurred vision, nausea, vomiting.  Denies history of smoking cigarettes or illicit drug use.   Chest Pain  History reviewed. No pertinent past medical history.  Patient Active Problem List   Diagnosis Date Noted   GERD (gastroesophageal reflux disease) 06/21/2017   Chronic viral hepatitis B without delta-agent (HCC) 06/18/2017    History reviewed. No pertinent surgical history.     Home Medications    Prior to Admission medications   Medication Sig Start Date End Date Taking? Authorizing Provider  omeprazole (PRILOSEC) 20 MG capsule Take 1 capsule (20 mg total) by mouth daily. 01/27/20   Mardella Layman, MD  cetirizine (ZYRTEC) 10 MG tablet Take 1 tablet (10 mg total) by mouth daily. 09/14/19 01/27/20  Dahlia Byes A, NP  fluticasone (FLONASE) 50 MCG/ACT nasal spray Place 2 sprays into both nostrils daily. 05/05/19 01/27/20  Eustace Moore, MD  levocetirizine (XYZAL) 5 MG tablet Take 1 tablet (5 mg total) by mouth every evening. 05/05/19 09/14/19  Eustace Moore, MD    Family History Family History  Family history unknown: Yes    Social History Social History   Tobacco Use   Smoking status: Never   Smokeless tobacco: Never   Vaping Use   Vaping Use: Never used  Substance Use Topics   Alcohol use: No   Drug use: No     Allergies   Patient has no known allergies.   Review of Systems Review of Systems  Cardiovascular:  Positive for chest pain.  Per HPI  Physical Exam Triage Vital Signs ED Triage Vitals [01/12/21 1234]  Enc Vitals Group     BP (!) 147/86     Pulse Rate 74     Resp 18     Temp 99.4 F (37.4 C)     Temp Source Oral     SpO2 98 %     Weight      Height      Head Circumference      Peak Flow      Pain Score 8     Pain Loc      Pain Edu?      Excl. in GC?    No data found.  Updated Vital Signs BP (!) 142/95 (BP Location: Left Arm)   Pulse 71   Temp 99.4 F (37.4 C)   Resp (!) 22   SpO2 98%   Visual Acuity Right Eye Distance:   Left Eye Distance:   Bilateral Distance:    Right Eye Near:   Left Eye Near:    Bilateral Near:     Physical Exam Constitutional:      General:  He is not in acute distress.    Appearance: He is not toxic-appearing or diaphoretic.  HENT:     Head: Normocephalic.     Right Ear: Tympanic membrane and ear canal normal.     Left Ear: Tympanic membrane and ear canal normal.     Nose: Congestion present.     Mouth/Throat:     Pharynx: Posterior oropharyngeal erythema present.  Cardiovascular:     Rate and Rhythm: Normal rate and regular rhythm.     Pulses: Normal pulses.     Heart sounds: Normal heart sounds.  Pulmonary:     Effort: Pulmonary effort is normal.     Breath sounds: Normal breath sounds.  Abdominal:     General: Bowel sounds are normal.     Palpations: Abdomen is soft.  Musculoskeletal:        General: Normal range of motion.  Skin:    General: Skin is warm and dry.  Neurological:     General: No focal deficit present.     Mental Status: He is alert and oriented to person, place, and time. Mental status is at baseline.  Psychiatric:        Mood and Affect: Mood normal.        Behavior: Behavior normal.         Thought Content: Thought content normal.        Judgment: Judgment normal.     UC Treatments / Results  Labs (all labs ordered are listed, but only abnormal results are displayed) Labs Reviewed - No data to display  EKG   Radiology No results found.  Procedures Procedures (including critical care time)  Medications Ordered in UC Medications  aspirin chewable tablet 324 mg (324 mg Oral Given 01/12/21 1308)  nitroGLYCERIN (NITROSTAT) SL tablet 0.4 mg (0.4 mg Sublingual Given 01/12/21 1326)    Initial Impression / Assessment and Plan / UC Course  I have reviewed the triage vital signs and the nursing notes.  Pertinent labs & imaging results that were available during my care of the patient were reviewed by me and considered in my medical decision making (see chart for details).     EKG completed showing "ST & T wave abnormality, consider inferolateral ischemia".  Patient was sent to the hospital via EMS to be further evaluated and managed.  Aspirin and nitro were administered in urgent care with some relief of chest pain.  Patient also appears to have beginning stages of viral upper respiratory infection.  Will defer further testing for COVID, flu, strep to emergency department.  Patient was agreeable with plan. Final Clinical Impressions(s) / UC Diagnoses   Final diagnoses:  Other chest pain  Generalized body aches  Viral upper respiratory illness     Discharge Instructions      Patient was sent to the hospital via EMS.     ED Prescriptions   None    PDMP not reviewed this encounter.   Lance Muss, FNP 01/12/21 [phone]

## 2021-02-07 ENCOUNTER — Telehealth (INDEPENDENT_AMBULATORY_CARE_PROVIDER_SITE_OTHER): Payer: 59 | Admitting: Primary Care

## 2021-02-20 ENCOUNTER — Encounter (INDEPENDENT_AMBULATORY_CARE_PROVIDER_SITE_OTHER): Payer: Self-pay | Admitting: Primary Care

## 2021-02-20 ENCOUNTER — Other Ambulatory Visit: Payer: Self-pay

## 2021-02-20 ENCOUNTER — Ambulatory Visit (INDEPENDENT_AMBULATORY_CARE_PROVIDER_SITE_OTHER): Payer: 59 | Admitting: Primary Care

## 2021-02-20 VITALS — BP 148/86 | HR 74 | Temp 97.5°F | Resp 16 | Ht 65.0 in | Wt 140.0 lb

## 2021-02-20 DIAGNOSIS — Z1211 Encounter for screening for malignant neoplasm of colon: Secondary | ICD-10-CM

## 2021-02-20 DIAGNOSIS — I10 Essential (primary) hypertension: Secondary | ICD-10-CM | POA: Diagnosis not present

## 2021-02-20 MED ORDER — AMLODIPINE BESYLATE 10 MG PO TABS: 10.0000 mg | ORAL_TABLET | Freq: Every day | ORAL | 1 refills | Status: DC

## 2021-02-20 NOTE — Progress Notes (Signed)
[facility]   Martin Wright is a 45 y.o. male presents for hypertension evaluation, Denies shortness of breath, headaches, chest pain or lower extremity edema, sudden onset, vision changes, unilateral weakness, dizziness, paresthesias   Patient is currently not on hypertensive medications.  Dietary habits include: no restrictions  Exercise habits include:yes walking  Family / Social history: No   No past medical history on file. No past surgical history on file. No Known Allergies Current Outpatient Medications on File Prior to Visit  Medication Sig Dispense Refill   omeprazole (PRILOSEC) 20 MG capsule Take 1 capsule (20 mg total) by mouth daily. (Patient not taking: Reported on 01/12/2021) 30 capsule 11   [DISCONTINUED] cetirizine (ZYRTEC) 10 MG tablet Take 1 tablet (10 mg total) by mouth daily. 30 tablet 0   [DISCONTINUED] fluticasone (FLONASE) 50 MCG/ACT nasal spray Place 2 sprays into both nostrils daily. 16 g 2   [DISCONTINUED] levocetirizine (XYZAL) 5 MG tablet Take 1 tablet (5 mg total) by mouth every evening. 14 tablet 0   No current facility-administered medications on file prior to visit.   Social History   Socioeconomic History   Marital status: Married    Spouse name: Not on file   Number of children: Not on file   Years of education: Not on file   Highest education level: Not on file  Occupational History   Not on file  Tobacco Use   Smoking status: Never   Smokeless tobacco: Never  Vaping Use   Vaping Use: Never used  Substance and Sexual Activity   Alcohol use: No   Drug use: No   Sexual activity: Yes    Partners: Female  Other Topics Concern   Not on file  Social History Narrative   Not on file   Social Determinants of Health   Financial Resource Strain: Not on file  Food Insecurity: Not on file  Transportation Needs: Not on file  Physical Activity: Not on file  Stress: Not on file  Social Connections: Not on file   Intimate Partner Violence: Not on file   Family History  Family history unknown: Yes     OBJECTIVE:  Vitals:   02/20/21 1005  BP: (!) 148/86  Pulse: 74  Resp: 16  SpO2: 98%  Weight: 140 lb (63.5 kg)  Height: 5\' 5"  (1.651 m)    Physical Exam Vitals reviewed.  Constitutional:      Appearance: He is normal weight.  HENT:     Head: Normocephalic.     Right Ear: Tympanic membrane and external ear normal.     Left Ear: Tympanic membrane and external ear normal.     Nose: Nose normal.  Eyes:     Extraocular Movements: Extraocular movements intact.     Pupils: Pupils are equal, round, and reactive to light.  Cardiovascular:     Rate and Rhythm: Normal rate and regular rhythm.  Pulmonary:     Effort: Pulmonary effort is normal.     Breath sounds: Normal breath sounds.  Abdominal:     General: Abdomen is flat. Bowel sounds are normal.     Palpations: Abdomen is soft.  Musculoskeletal:        General: Normal range of motion.     Cervical back: Normal range of motion and neck supple.  Skin:    General: Skin is warm and dry.  Neurological:     Mental Status: He is alert and oriented to person, place, and time.  Psychiatric:  Mood and Affect: Mood normal.        Behavior: Behavior normal.        Thought Content: Thought content normal.        Judgment: Judgment normal.    Review of Systems  All other systems reviewed and are negative.  Last 3 Office BP readings: BP Readings from Last 3 Encounters:  02/20/21 (!) 148/86  01/12/21 (!) 115/98  01/12/21 (!) 142/95    BMET    Component Value Date/Time   NA 136 01/12/2021 1555   NA 139 05/07/2017 1040   K 3.8 01/12/2021 1555   CL 103 01/12/2021 1555   CO2 26 01/12/2021 1555   GLUCOSE 95 01/12/2021 1555   BUN 10 01/12/2021 1555   BUN 15 05/07/2017 1040   CREATININE 1.43 (H) 01/12/2021 1555   CALCIUM 9.0 01/12/2021 1555   GFRNONAA >60 01/12/2021 1555   GFRAA 82 05/07/2017 1040    Renal function: CrCl  cannot be calculated (Patient's most recent lab result is older than the maximum 21 days allowed.).  Clinical ASCVD: No  The ASCVD Risk score (Arnett DK, et al., 2019) failed to calculate for the following reasons:   Cannot find a previous HDL lab   Cannot find a previous total cholesterol lab  ASCVD risk factors include- [place]   ASSESSMENT & PLAN: Diagnoses and all orders for this visit:  Colon cancer screening -     Ambulatory referral to Gastroenterology  Essential hypertension -     amLODipine (NORVASC) 10 MG tablet; Take 1 tablet (10 mg total) by mouth daily. -Counseled on lifestyle modifications for blood pressure control including reduced dietary sodium, increased exercise, weight reduction and adequate sleep. Also, educated patient about the risk for cardiovascular events, stroke and heart attack. Also counseled patient about the importance of medication adherence. If you participate in smoking, it is important to stop using tobacco as this will increase the risks associated with uncontrolled blood pressure.   -Hypertension newly diagnosed currently  on no  medications.  Goal BP:  For patients younger than 60: Goal BP < 130/80. For patients 60 and older: Goal BP < 140/90. For patients with diabetes: Goal BP < 130/80. Your most recent BP: 148/86  Minimize salt intake. Minimize alcohol intake    This note has been created with Education officer, environmental. Any transcriptional errors are unintentional.   Grayce Sessions, NP 02/20/2021, 10:12 AM

## 2021-02-20 NOTE — Patient Instructions (Signed)

## 2021-02-20 NOTE — Progress Notes (Signed)
Check up Request RF on medication

## 2021-03-30 ENCOUNTER — Ambulatory Visit: Payer: 59 | Admitting: Cardiology

## 2021-03-31 ENCOUNTER — Telehealth: Payer: Self-pay

## 2021-03-31 NOTE — Telephone Encounter (Signed)
NOTES SCANNED TO REFERRAL 

## 2021-04-03 ENCOUNTER — Ambulatory Visit (INDEPENDENT_AMBULATORY_CARE_PROVIDER_SITE_OTHER): Payer: 59 | Admitting: Primary Care

## 2021-04-07 ENCOUNTER — Telehealth: Payer: Self-pay

## 2021-04-07 NOTE — Telephone Encounter (Signed)
ERROR

## 2021-04-11 ENCOUNTER — Encounter: Payer: Self-pay | Admitting: Cardiology

## 2021-04-11 ENCOUNTER — Other Ambulatory Visit: Payer: Self-pay

## 2021-04-11 ENCOUNTER — Ambulatory Visit (INDEPENDENT_AMBULATORY_CARE_PROVIDER_SITE_OTHER): Payer: 59 | Admitting: Cardiology

## 2021-04-11 VITALS — BP 124/86 | HR 65 | Ht 65.0 in | Wt 143.8 lb

## 2021-04-11 DIAGNOSIS — T733XXA Exhaustion due to excessive exertion, initial encounter: Secondary | ICD-10-CM

## 2021-04-11 DIAGNOSIS — R079 Chest pain, unspecified: Secondary | ICD-10-CM | POA: Diagnosis not present

## 2021-04-11 MED ORDER — METOPROLOL TARTRATE 50 MG PO TABS: 50.0000 mg | ORAL_TABLET | Freq: Once | ORAL | 0 refills | Status: DC

## 2021-04-11 NOTE — Patient Instructions (Addendum)
Medication Instructions:  Your physician recommends that you continue on your current medications as directed. Please refer to the Current Medication list given to you today.  *If you need a refill on your cardiac medications before your next appointment, please call your pharmacy*   Lab Work: TODAY: TSH and BMET If you have labs (blood work) drawn today and your tests are completely normal, you will receive your results only by: MyChart Message (if you have MyChart) OR A paper copy in the mail If you have any lab test that is abnormal or we need to change your treatment, we will call you to review the results.   Testing/Procedures: Your physician has requested that you have an echocardiogram. Echocardiography is a painless test that uses sound waves to create images of your heart. It provides your doctor with information about the size and shape of your heart and how well your heart's chambers and valves are working. This procedure takes approximately one hour. There are no restrictions for this procedure.  Your physician has requested that you have a coronary CTA scan. Please see below for further instructions.    Follow-Up: At [facility], you and your health needs are our priority.  As part of our continuing mission to provide you with exceptional heart care, we have created designated Provider Care Teams.  These Care Teams include your primary Cardiologist (physician) and Advanced Practice Providers (APPs -  Physician Assistants and Nurse Practitioners) who all work together to provide you with the care you need, when you need it.  Follow up with Dr. Mayford Knife as needed based on results of testing.    Other Instructions   Your cardiac CT will be scheduled at:   [facility] [address] [place], [place] [address] [phone]  If scheduled at [facility], please arrive at the [facility] main entrance (entrance A) of [facility] 30 minutes prior  to test start time. You can use the FREE valet parking offered at the main entrance (encouraged to control the heart rate for the test) Proceed to the [facility] Radiology Department (first floor) to check-in and test prep.  If scheduled at [facility], please arrive 15 mins early for check-in and test prep.  Please follow these instructions carefully (unless otherwise directed):  Hold all erectile dysfunction medications at least 3 days (72 hrs) prior to test.  On the Night Before the Test: Be sure to Drink plenty of water. Do not consume any caffeinated/decaffeinated beverages or chocolate 12 hours prior to your test. Do not take any antihistamines 12 hours prior to your test.  On the Day of the Test: Drink plenty of water until 1 hour prior to the test. Do not eat any food 4 hours prior to the test. You may take your regular medications prior to the test.  Take metoprolol (Lopressor) two hours prior to test.      After the Test: Drink plenty of water. After receiving IV contrast, you may experience a mild flushed feeling. This is normal. On occasion, you may experience a mild rash up to 24 hours after the test. This is not dangerous. If this occurs, you can take Benadryl 25 mg and increase your fluid intake. If you experience trouble breathing, this can be serious. If it is severe call 911 IMMEDIATELY. If it is mild, please call our office. If you take any of these medications: Glipizide/Metformin, Avandament, Glucavance, please do not take 48 hours after completing  test unless otherwise instructed.  Please allow 2-4 weeks for scheduling of routine cardiac CTs. Some insurance companies require a pre-authorization which may delay scheduling of this test.   For non-scheduling related questions, please contact the cardiac imaging nurse navigator should you have any questions/concerns: Rockwell Alexandria, Cardiac Imaging Nurse Navigator Larey Brick, Cardiac  Imaging Nurse Navigator [facility] Direct Office Dial: [phone]   For scheduling needs, including cancellations and rescheduling, please call [place], [phone].

## 2021-04-11 NOTE — Addendum Note (Signed)
Addended by: Theresia Majors on: 04/11/2021 09:12 AM   Modules accepted: Orders

## 2021-04-11 NOTE — Progress Notes (Signed)
Cardiology CONSULT Note    Date:  04/11/2021   ID:  Martin Wright, DOB 07-15-1975, MRN [id number]  PCP:  Martin Sessions, NP  Cardiologist:  Martin Magic, MD   Chief Complaint  Patient presents with   New Patient (Initial Visit)    Chest pain and exertional fatigue     History of Present Illness:  Martin Wright is a 45 y.o. male who is being seen today for the evaluation of chest pain and fatigue at the request of Martin Mulders, MD.  He tells me that he has been having chest pain off and on for about 3 months.  He says that 2 months ago he had COVID 19 and since then he started having CP and it is sporadic.  It is exertional and nonexertional.   He describes it as a sharp pain with no radiation.  There is no associated sx of nausea, diaphoresis or SOB.  He says that sometimes he would get CP with constipation as well.  He denies any LE edema, dizziness or syncope. He has occasional episodes of rapid heart beat usually about 1 times monthly.  He has never smoked. He has no family hx of CAD.  EKG at PCP showed NSR with lateral T wave inversions  Past Medical History:  Diagnosis Date   Abdominal pain    Abscess    Dyspepsia    GERD (gastroesophageal reflux disease)    H. pylori infection    Hypertension    Precordial chest pain    Viral syndrome     No past surgical history on file.  Current Medications: Current Meds  Medication Sig   amLODipine (NORVASC) 10 MG tablet Take 1 tablet (10 mg total) by mouth daily.   omeprazole (PRILOSEC) 20 MG capsule Take 1 capsule (20 mg total) by mouth daily.    Allergies:   Patient has no known allergies.   Social History   Socioeconomic History   Marital status: Married    Spouse name: Not on file   Number of children: Not on file   Years of education: Not on file   Highest education level: Not on file  Occupational History   Not on file  Tobacco Use   Smoking status: Never   Smokeless tobacco: Never   Vaping Use   Vaping Use: Never used  Substance and Sexual Activity   Alcohol use: No   Drug use: No   Sexual activity: Yes    Partners: Female  Other Topics Concern   Not on file  Social History Narrative   Not on file   Social Determinants of Health   Financial Resource Strain: Not on file  Food Insecurity: Not on file  Transportation Needs: Not on file  Physical Activity: Not on file  Stress: Not on file  Social Connections: Not on file     Family History:  The patient's Family history is unknown by patient.   ROS:   Please see the history of present illness.    ROS All other systems reviewed and are negative.  No flowsheet data found.     PHYSICAL EXAM:   VS:  BP 124/86   Pulse 65   Ht 5\' 5"  (1.651 m)   Wt 143 lb 12.8 oz (65.2 kg)   SpO2 96%   BMI 23.93 kg/m    GEN: Well nourished, well developed, in no acute distress  HEENT: normal  Neck: no JVD, carotid bruits, or masses Cardiac: RRR; no  murmurs, rubs, or gallops,no edema.  Intact distal pulses bilaterally.  Respiratory:  clear to auscultation bilaterally, normal work of breathing GI: soft, nontender, nondistended, + BS MS: no deformity or atrophy  Skin: warm and dry, no rash Neuro:  Alert and Oriented x 3, Strength and sensation are intact Psych: euthymic mood, full affect  Wt Readings from Last 3 Encounters:  04/11/21 143 lb 12.8 oz (65.2 kg)  02/20/21 140 lb (63.5 kg)  01/12/21 147 lb 11.3 oz (67 kg)      Studies/Labs Reviewed:   EKG:  EKG is not ordered today.  The ekg ordered today demonstrates NSR with lateral T wave inversions  Recent Labs: 01/12/2021: ALT 18; BUN 10; Creatinine, Ser 1.43; Hemoglobin 16.8; Platelets 168; Potassium 3.8; Sodium 136   Lipid Panel No results found for: CHOL, TRIG, HDL, CHOLHDL, VLDL, LDLCALC, LDLDIRECT  Additional studies/ records that were reviewed today include:  OV notes and EKG from PCP    ASSESSMENT:    1. Chest pain of uncertain etiology   2.  Fatigue due to excessive exertion, initial encounter      PLAN:  In order of problems listed above:  Chest pain -His symptoms are somewhat atypical but EKG is abnormal -His only cardiac risk factor is hypertension.  He has never smoked. -EKG showed lateral T wave inversions -I will get a coronary CTA to define coronary anatomy  2.   Exertional fatigue -This may be due to deconditioning but chest pain need to consider coronary ischemia -Check 2D echocardiogram to assess LV function -Check TSH -hemoglobin was normal in August  Time Spent: 25 minutes total time of encounter, including 15 minutes spent in face-to-face patient care on the date of this encounter. This time includes coordination of care and counseling regarding above mentioned problem list. Remainder of non-face-to-face time involved reviewing chart documents/testing relevant to the patient encounter and documentation in the medical record. I have independently reviewed documentation from referring provider  Medication Adjustments/Labs and Tests Ordered: Current medicines are reviewed at length with the patient today.  Concerns regarding medicines are outlined above.  Medication changes, Labs and Tests ordered today are listed in the Patient Instructions below.  There are no Patient Instructions on file for this visit.   Signed, Martin Magic, MD  04/11/2021 9:04 AM    [facility] [address] [place], [place], [place]  [address] Phone: [phone]; Fax: [phone]

## 2021-04-13 LAB — BASIC METABOLIC PANEL
BUN/Creatinine Ratio: 11 (ref 9–20)
BUN: 12 mg/dL (ref 6–24)
CO2: 24 mmol/L (ref 20–29)
Calcium: 10.2 mg/dL (ref 8.7–10.2)
Chloride: 101 mmol/L (ref 96–106)
Creatinine, Ser: 1.12 mg/dL (ref 0.76–1.27)
Glucose: 119 mg/dL — ABNORMAL HIGH (ref 70–99)
Potassium: 4.1 mmol/L (ref 3.5–5.2)
Sodium: 138 mmol/L (ref 134–144)
eGFR: 83 mL/min/{1.73_m2} (ref >59)

## 2021-04-13 LAB — TSH: TSH: 2.62 u[IU]/mL (ref 0.450–4.500)

## 2021-04-21 ENCOUNTER — Telehealth (HOSPITAL_COMMUNITY): Payer: Self-pay | Admitting: *Deleted

## 2021-04-21 NOTE — Telephone Encounter (Signed)
Attempted to call patient regarding upcoming cardiac CT appointment. °Left message on voicemail with name and callback number ° °Aristotelis Vilardi RN Navigator Cardiac Imaging °[facility] °[phone] Office °[phone] Cell ° °

## 2021-04-24 ENCOUNTER — Other Ambulatory Visit: Payer: Self-pay

## 2021-04-24 ENCOUNTER — Ambulatory Visit (HOSPITAL_BASED_OUTPATIENT_CLINIC_OR_DEPARTMENT_OTHER): Payer: 59

## 2021-04-24 ENCOUNTER — Ambulatory Visit (HOSPITAL_COMMUNITY)
Admission: RE | Admit: 2021-04-24 | Discharge: 2021-04-24 | Disposition: A | Discharge status: Home / Self Care | Payer: 59 | Source: Ambulatory Visit | Attending: Cardiology | Admitting: Cardiology

## 2021-04-24 ENCOUNTER — Encounter (HOSPITAL_COMMUNITY): Payer: Self-pay

## 2021-04-24 DIAGNOSIS — T733XXA Exhaustion due to excessive exertion, initial encounter: Secondary | ICD-10-CM | POA: Diagnosis not present

## 2021-04-24 DIAGNOSIS — R079 Chest pain, unspecified: Secondary | ICD-10-CM | POA: Insufficient documentation

## 2021-04-24 LAB — ECHOCARDIOGRAM COMPLETE
Area-P 1/2: 2.56 cm2
P 1/2 time: 572 msec
S' Lateral: 3 cm

## 2021-04-24 MED ORDER — IOHEXOL 350 MG/ML SOLN
95.0000 mL | Freq: Once | INTRAVENOUS | Status: AC | PRN
  Administered 2021-04-24: 95 mL via INTRAVENOUS

## 2021-04-24 MED ORDER — NITROGLYCERIN 0.4 MG SL SUBL
SUBLINGUAL_TABLET | SUBLINGUAL | Status: AC
  Filled 2021-04-24: qty 2

## 2021-04-24 MED ORDER — NITROGLYCERIN 0.4 MG SL SUBL
0.8000 mg | SUBLINGUAL_TABLET | Freq: Once | SUBLINGUAL | Status: AC
  Administered 2021-04-24: 0.8 mg via SUBLINGUAL

## 2021-04-25 ENCOUNTER — Encounter: Payer: Self-pay | Admitting: Cardiology

## 2021-04-25 DIAGNOSIS — Q2112 Patent foramen ovale: Secondary | ICD-10-CM | POA: Insufficient documentation

## 2021-05-02 ENCOUNTER — Telehealth: Payer: Self-pay | Admitting: Cardiology

## 2021-05-02 DIAGNOSIS — R079 Chest pain, unspecified: Secondary | ICD-10-CM

## 2021-05-02 NOTE — Telephone Encounter (Signed)
Quintella Reichert, MD  04/25/2021  9:55 AM EST     2D echo showed normal LV function with EF 60 to 65% with mildly thickened heart muscle and mildly increased stiffness of the heart muscle.  There was trivial leakiness of the aortic and mitral valves.  The ascending aorta was mildly dilated at 41 mm which was confirmed at coronary CTA measuring 48 mm.  Repeat 2D echo limited in 1 year for dilated ascending aorta.   Quintella Reichert, MD  04/25/2021 10:06 AM EST     Please let patient know that coronary CTA showed mildly dilated ascending aorta at 40 mm.  The main pulmonary artery was mildly dilated at 31 mm.  There is a small PFO.  Coronary calcium score is 0 with no CAD.  CTA in 1 year for dilated ascending aorta

## 2021-05-02 NOTE — Telephone Encounter (Signed)
The patient has been notified of the result and verbalized understanding.  All questions (if any) were answered. Theresia Majors, RN 05/02/2021 3:48 PM

## 2021-05-02 NOTE — Telephone Encounter (Signed)
Patient is returning call to discuss CT results. 

## 2021-05-02 NOTE — Telephone Encounter (Signed)
Left message for patient to call back  

## 2021-08-22 ENCOUNTER — Other Ambulatory Visit (INDEPENDENT_AMBULATORY_CARE_PROVIDER_SITE_OTHER): Payer: Self-pay | Admitting: Primary Care

## 2021-08-22 DIAGNOSIS — I10 Essential (primary) hypertension: Secondary | ICD-10-CM

## 2021-08-22 NOTE — Telephone Encounter (Signed)
Sent to PCP ?

## 2021-11-16 ENCOUNTER — Other Ambulatory Visit (INDEPENDENT_AMBULATORY_CARE_PROVIDER_SITE_OTHER): Payer: Self-pay | Admitting: Primary Care

## 2021-11-16 DIAGNOSIS — I10 Essential (primary) hypertension: Secondary | ICD-10-CM

## 2021-12-23 ENCOUNTER — Other Ambulatory Visit: Payer: Self-pay

## 2021-12-23 ENCOUNTER — Encounter (HOSPITAL_COMMUNITY): Payer: Self-pay | Admitting: Emergency Medicine

## 2021-12-23 ENCOUNTER — Emergency Department (HOSPITAL_COMMUNITY)
Admission: EM | Admit: 2021-12-23 | Discharge: 2021-12-23 | Disposition: A | Discharge status: Home / Self Care | Payer: 59 | Attending: Emergency Medicine | Admitting: Emergency Medicine

## 2021-12-23 ENCOUNTER — Emergency Department (HOSPITAL_COMMUNITY): Payer: 59

## 2021-12-23 DIAGNOSIS — R42 Dizziness and giddiness: Secondary | ICD-10-CM

## 2021-12-23 DIAGNOSIS — Z20822 Contact with and (suspected) exposure to covid-19: Secondary | ICD-10-CM | POA: Insufficient documentation

## 2021-12-23 DIAGNOSIS — Z7951 Long term (current) use of inhaled steroids: Secondary | ICD-10-CM | POA: Insufficient documentation

## 2021-12-23 DIAGNOSIS — Z79899 Other long term (current) drug therapy: Secondary | ICD-10-CM | POA: Insufficient documentation

## 2021-12-23 DIAGNOSIS — B349 Viral infection, unspecified: Secondary | ICD-10-CM

## 2021-12-23 LAB — COMPREHENSIVE METABOLIC PANEL
ALT: 18 U/L (ref 0–44)
AST: 31 U/L (ref 15–41)
Albumin: 4.2 g/dL (ref 3.5–5.0)
Alkaline Phosphatase: 49 U/L (ref 38–126)
Anion gap: 13 (ref 5–15)
BUN: 8 mg/dL (ref 6–20)
CO2: 23 mmol/L (ref 22–32)
Calcium: 9 mg/dL (ref 8.9–10.3)
Chloride: 101 mmol/L (ref 98–111)
Creatinine, Ser: 1.07 mg/dL (ref 0.61–1.24)
GFR, Estimated: >60 mL/min (ref >60)
Glucose, Bld: 149 mg/dL — ABNORMAL HIGH (ref 70–99)
Potassium: 3.1 mmol/L — ABNORMAL LOW (ref 3.5–5.1)
Sodium: 137 mmol/L (ref 135–145)
Total Bilirubin: 1.2 mg/dL (ref 0.3–1.2)
Total Protein: 7.2 g/dL (ref 6.5–8.1)

## 2021-12-23 LAB — SARS CORONAVIRUS 2 BY RT PCR: SARS Coronavirus 2 by RT PCR: NEGATIVE

## 2021-12-23 LAB — CBC WITH DIFFERENTIAL/PLATELET
Abs Immature Granulocytes: 0.02 10*3/uL (ref 0.00–0.07)
Basophils Absolute: 0.1 10*3/uL (ref 0.0–0.1)
Basophils Relative: 1 %
Eosinophils Absolute: 1.3 10*3/uL — ABNORMAL HIGH (ref 0.0–0.5)
Eosinophils Relative: 15 %
HCT: 49.5 % (ref 39.0–52.0)
Hemoglobin: 16.5 g/dL (ref 13.0–17.0)
Immature Granulocytes: 0 %
Lymphocytes Relative: 32 %
Lymphs Abs: 2.9 10*3/uL (ref 0.7–4.0)
MCH: 29.4 pg (ref 26.0–34.0)
MCHC: 33.3 g/dL (ref 30.0–36.0)
MCV: 88.2 fL (ref 80.0–100.0)
Monocytes Absolute: 0.8 10*3/uL (ref 0.1–1.0)
Monocytes Relative: 9 %
Neutro Abs: 3.8 10*3/uL (ref 1.7–7.7)
Neutrophils Relative %: 43 %
Platelets: 229 10*3/uL (ref 150–400)
RBC: 5.61 MIL/uL (ref 4.22–5.81)
RDW: 11.4 % — ABNORMAL LOW (ref 11.5–15.5)
WBC: 8.9 10*3/uL (ref 4.0–10.5)
nRBC: 0 % (ref 0.0–0.2)

## 2021-12-23 LAB — URINALYSIS, ROUTINE W REFLEX MICROSCOPIC
Bilirubin Urine: NEGATIVE
Glucose, UA: NEGATIVE mg/dL
Hgb urine dipstick: NEGATIVE
Ketones, ur: NEGATIVE mg/dL
Leukocytes,Ua: NEGATIVE
Nitrite: NEGATIVE
Protein, ur: NEGATIVE mg/dL
Specific Gravity, Urine: 1.01 (ref 1.005–1.030)
pH: 8 (ref 5.0–8.0)

## 2021-12-23 LAB — TROPONIN I (HIGH SENSITIVITY): Troponin I (High Sensitivity): 5 ng/L (ref <18)

## 2021-12-23 MED ORDER — SODIUM CHLORIDE 0.9 % IV BOLUS
1000.0000 mL | Freq: Once | INTRAVENOUS | Status: AC
  Administered 2021-12-23: 1000 mL via INTRAVENOUS

## 2021-12-23 MED ORDER — MECLIZINE HCL 25 MG PO TABS
25.0000 mg | ORAL_TABLET | Freq: Once | ORAL | Status: AC
  Administered 2021-12-23: 25 mg via ORAL
  Filled 2021-12-23: qty 1

## 2021-12-23 MED ORDER — ONDANSETRON 4 MG PO TBDP: 4.0000 mg | ORAL_TABLET | Freq: Three times a day (TID) | ORAL | 0 refills | Status: DC | PRN

## 2021-12-23 MED ORDER — POTASSIUM CHLORIDE CRYS ER 20 MEQ PO TBCR
40.0000 meq | EXTENDED_RELEASE_TABLET | Freq: Once | ORAL | Status: AC
Start: 2021-12-23 — End: 2021-12-23
  Administered 2021-12-23: 40 meq via ORAL
  Filled 2021-12-23: qty 2

## 2021-12-23 NOTE — ED Triage Notes (Signed)
Patient presents with multiple complaints : Chills "shivering" this morning with nausea , lightheaded/dizzy , generalized weakness and fatigue .

## 2021-12-23 NOTE — ED Notes (Signed)
Ambulated to the bathroom unassited and back no c/o dizziness

## 2021-12-23 NOTE — ED Notes (Signed)
Pt states he is able to take the oral medications now. Pt given the Potassium Pills by mouth at this time.

## 2021-12-23 NOTE — ED Notes (Signed)
Drinking hot tea and eating saltine crackers

## 2021-12-23 NOTE — ED Provider Notes (Signed)
[facility] EMERGENCY DEPARTMENT Provider Note   CSN: [id number] Arrival date & time: 12/23/21  8469     History  Chief Complaint  Patient presents with   Chills/Lightheaded    Martin Wright is a 46 y.o. male.  Pt complains of fever, chills and headache.  Pt complains of continued dizziness.  Pt complains of feeling weak all over.  Patient complains of feeling nauseated and feeling like he is going to vomit.  Patient denies any abdominal pain he denies any chest pain.  He reports he feels weak all over he does not have any facial weakness he does not have any weakness in his extremities.  Patient complains of a headache.  Patient denies any diarrhea.  Patient denies any exposures to anyone with any illness.  Patient denies any exposures to COVID or influenza        Home Medications Prior to Admission medications   Medication Sig Start Date End Date Taking? Authorizing Provider  amLODipine (NORVASC) 10 MG tablet TAKE 1 TABLET BY MOUTH EVERY DAY 08/22/21   Grayce Sessions, NP  metoprolol tartrate (LOPRESSOR) 50 MG tablet Take 1 tablet (50 mg total) by mouth once for 1 dose. Take 1 tablet (50 mg total) two hours prior to CT scan. 04/11/21 04/11/21  Quintella Reichert, MD  omeprazole (PRILOSEC) 20 MG capsule Take 1 capsule (20 mg total) by mouth daily. 01/27/20   Mardella Layman, MD  cetirizine (ZYRTEC) 10 MG tablet Take 1 tablet (10 mg total) by mouth daily. 09/14/19 01/27/20  Dahlia Byes A, NP  fluticasone (FLONASE) 50 MCG/ACT nasal spray Place 2 sprays into both nostrils daily. 05/05/19 01/27/20  Eustace Moore, MD  levocetirizine (XYZAL) 5 MG tablet Take 1 tablet (5 mg total) by mouth every evening. 05/05/19 09/14/19  Eustace Moore, MD      Allergies    Patient has no known allergies.    Review of Systems   Review of Systems  Constitutional:  Positive for chills and fever.  Eyes:  Negative for pain and visual disturbance.  Cardiovascular:  Negative for  chest pain.  Gastrointestinal:  Negative for vomiting.  Genitourinary:  Negative for dysuria.  Skin:  Negative for color change and rash.  Neurological:  Positive for dizziness and light-headedness.  All other systems reviewed and are negative.   Physical Exam Updated Vital Signs BP (!) 156/94 (BP Location: Left Arm)   Pulse 65   Temp 98.1 F (36.7 C) (Oral)   Resp (!) 22   SpO2 99%  Physical Exam Vitals and nursing note reviewed.  Constitutional:      Appearance: He is well-developed.  HENT:     Head: Normocephalic.     Right Ear: Tympanic membrane normal.     Left Ear: Tympanic membrane normal.     Nose: Nose normal.     Mouth/Throat:     Mouth: Mucous membranes are moist.  Eyes:     Conjunctiva/sclera: Conjunctivae normal.     Pupils: Pupils are equal, round, and reactive to light.  Cardiovascular:     Rate and Rhythm: Normal rate and regular rhythm.  Pulmonary:     Effort: Pulmonary effort is normal.  Abdominal:     General: Abdomen is flat. There is no distension.  Musculoskeletal:        General: Normal range of motion.     Cervical back: Normal range of motion and neck supple.  Skin:    General: Skin is warm.  Neurological:     General: No focal deficit present.     Mental Status: He is alert and oriented to person, place, and time.  Psychiatric:        Mood and Affect: Mood normal.     ED Results / Procedures / Treatments   Labs (all labs ordered are listed, but only abnormal results are displayed) Labs Reviewed  CBC WITH DIFFERENTIAL/PLATELET - Abnormal; Notable for the following components:      Result Value   RDW 11.4 (*)    Eosinophils Absolute 1.3 (*)    All other components within normal limits  URINALYSIS, ROUTINE W REFLEX MICROSCOPIC - Abnormal; Notable for the following components:   Color, Urine STRAW (*)    APPearance HAZY (*)    All other components within normal limits  COMPREHENSIVE METABOLIC PANEL    EKG None  Radiology No  results found.  Procedures Procedures    Medications Ordered in ED Medications - No data to display  ED Course/ Medical Decision Making/ A&P                           Medical Decision Making Patient complains of fever chills and feeling dizzy  Amount and/or Complexity of Data Reviewed Independent Historian: spouse    Details: Patient is here with spouse who is supportive External Data Reviewed: notes.    Details: Cardiology notes reviewed. Labs: ordered. Decision-making details documented in ED Course.    Details: Labs ordered reviewed and interpreted patient has a negative troponin Radiology: ordered and independent interpretation performed. Decision-making details documented in ED Course.    Details: CT head no acute abnormality  Risk Prescription drug management. Risk Details: Patient given IV fluids x1 L patient is given Antivert 25 mg.  He reports the dizziness has resolved and he feels better           Final Clinical Impression(s) / ED Diagnoses Final diagnoses:  Dizziness  Viral illness    Rx / DC Orders ED Discharge Orders     None      An After Visit Summary was printed and given to the patient.    Elson Areas, [place] 12/23/21 1454    Terald Sleeper, MD 12/23/21 [phone]

## 2021-12-23 NOTE — Discharge Instructions (Addendum)
Follow up with your provider for recheck in 2-3 days

## 2021-12-23 NOTE — ED Notes (Signed)
Pt return from CT, ambulating to bathroom with spouse. Will use call light when return to room to be placed back on monitor and fluids.

## 2021-12-23 NOTE — ED Notes (Signed)
Mr. Dials sitting up in bed. States feels somewhat alright just waiting on what the doctor decides on next.  MD notified.

## 2022-04-14 IMAGING — CT CT HEART MORP W/ CTA COR W/ SCORE W/ CA W/CM &/OR W/O CM
4 of 7 series · 8 of 20 positions shown, 9 images · IV contrast (APPLIED)
Comparison: None.
COMPARISON: None.

Addendum:
EXAM:
OVER-READ INTERPRETATION  CT CHEST

The following report is an over-read performed by radiologist Dr.
Rodas Beas [REDACTED] on 04/24/2021. This
over-read does not include interpretation of cardiac or coronary
anatomy or pathology. The coronary calcium score/coronary CTA
interpretation by the cardiologist is attached.
CLINICAL DATA: 45 Year old African American Male
Cardiac/Coronary  CTA
TECHNIQUE: The patient was scanned on a Phillips Force scanner.

[Series 6: ts diast sharp · axial · 0.33mm/px · z∈[+1055,+1096]mm · 2 of 311 slices shown]
[im 104/311  lung]
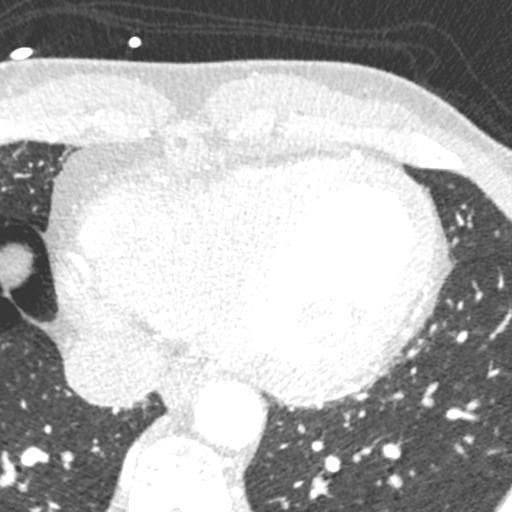
[im 207/311  lung]
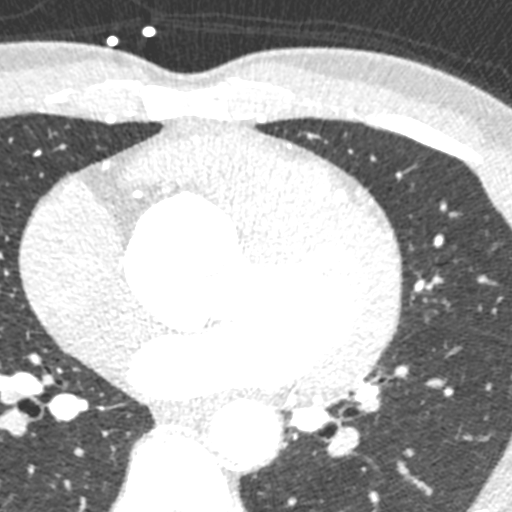

[Series 7: ts syst sharp · axial · 0.33mm/px · z∈[+1055,+1096]mm · 2 of 311 slices shown]
[im 104/311  lung]
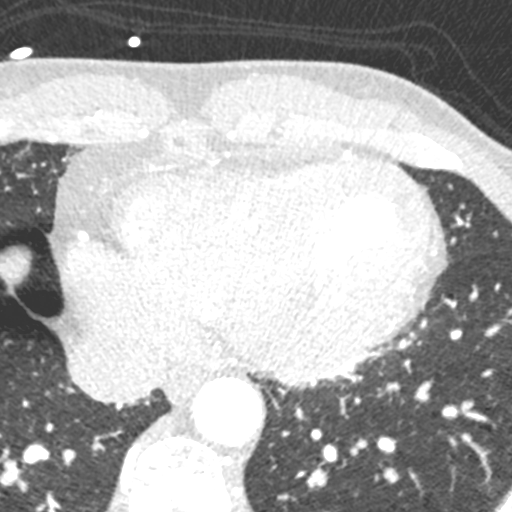
[im 207/311  lung]
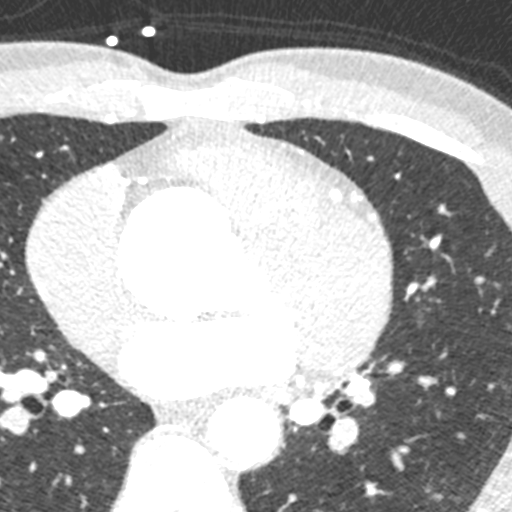

[Series 8: best syst · axial · 0.33mm/px · z∈[+1055,+1096]mm · 2 of 311 slices shown, 3 images]
[im 104/311  vessel]
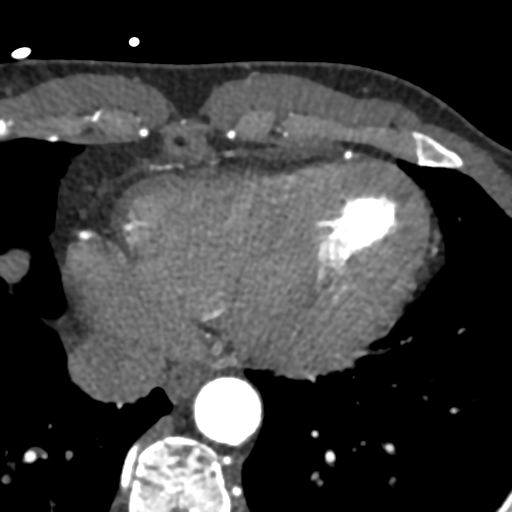
[im 104/311  lung]
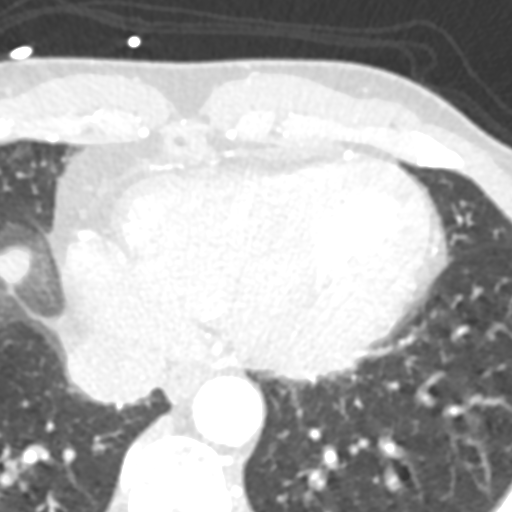
[im 207/311  vessel]
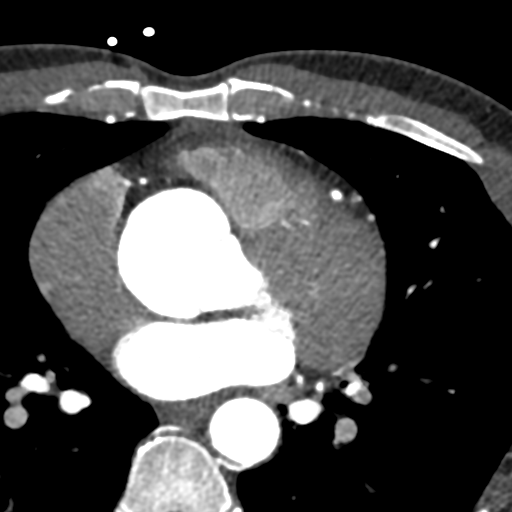

[Series 9: best diast · axial · 0.33mm/px · z∈[+1055,+1096]mm · 2 of 311 slices shown]
[im 104/311  vessel]
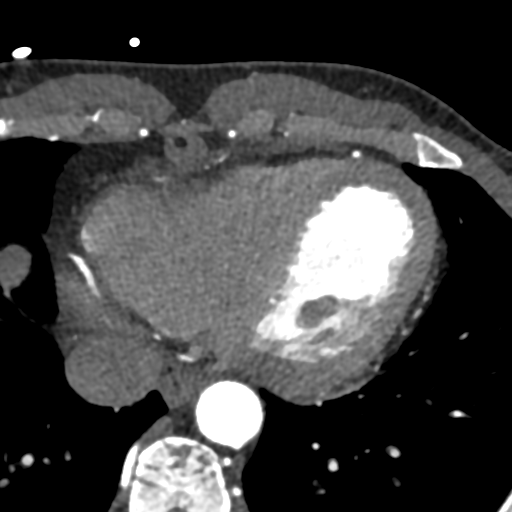
[im 207/311  vessel]
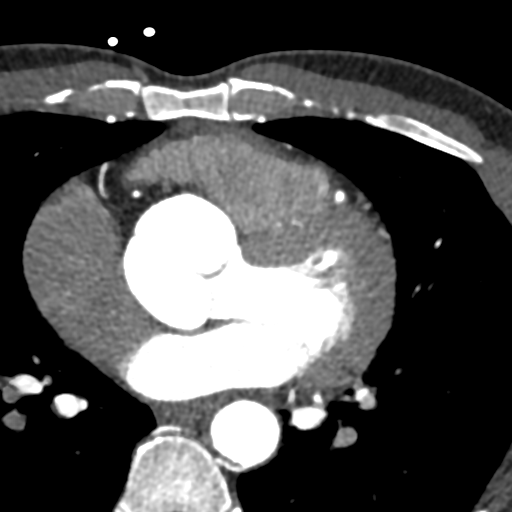

[8 of 20 positions shown; findings below may reference images not displayed]

FINDINGS: Ectasia of ascending thoracic aorta (4.0 cm in diameter). Small
calcified granulomas are noted in the right lower lobe. Within the
visualized portions of the thorax there are no suspicious appearing
pulmonary nodules or masses, there is no acute consolidative
airspace disease, no pleural effusions, no pneumothorax and no
lymphadenopathy. Visualized portions of the upper abdomen
demonstrates several calcified granulomas within the liver. There
are no aggressive appearing lytic or blastic lesions noted in the
visualized portions of the skeleton.
IMPRESSION: 1. Ectasia of ascending thoracic aorta (4.0 cm in diameter).
Recommend annual imaging followup by CTA or MRA. This recommendation
follows 7242 ACCF/AHA/AATS/ACR/ASA/SCA/JEGUNDO/ARISSA/GADUNHAS/EUGENIO EDUARDO Guidelines
for the Diagnosis and Management of Patients with Thoracic Aortic
Disease. Circulation. 7242; 121: E266-e369. Aortic aneurysm NOS
(ZE320-XW3.P).
2. Old granulomatous disease, as above.
FINDINGS: Scan was triggered in the descending thoracic aorta. Axial
non-contrast 3 mm slices were carried out through the heart. The
data set was analyzed on a dedicated work station and scored using
the Agatson method. Gantry rotation speed was 250 msecs and
collimation was .6 mm. 0.8 mg of sl NTG was given. The 3D data set
was reconstructed in 5% intervals of the 67-82 % of the R-R cycle.
Diastolic phases were analyzed on a dedicated work station using
MPR, MIP and VRT modes. The patient received 95 cc of contrast.

Aorta: Mild thoracic aortic aneurysm, 40 mm. No calcifications. No
dissection.

Main Pulmonary Artery: Mild pulmonary artery dilation 31 mm.

Aortic Valve:  Tri-leaflet.  No calcifications.

Coronary Arteries:  Normal coronary origin.  Right dominance.

Coronary Calcium Score:

Left main: 0

Left anterior descending artery: 0

Left circumflex artery: 0

Right coronary artery: 0

Total: 0

Percentile: 1st for age, sex, and race matched control.

RCA is a large dominant artery that gives rise to PDA and PLA. There
is no significant plaque. There is patient motion stair step
artifact in the mid RCA.

Left main is a large artery that gives rise to LAD and LCX arteries.
There is no significant plaque.

LAD is a large vessel that gives rise to one four diagonal branches.
There is no significant plaque.

LCX is a non-dominant artery that gives rise to one large OM1
branch. There is no significant plaque.

Other findings:

Normal pulmonary vein drainage into the left atrium.

Normal left atrial appendage without a thrombus.

Small PFO.

Extra-cardiac findings: See attached radiology report for
non-cardiac structures.

Patient motion artifact noted.
IMPRESSION: 1. Coronary calcium score of 0. This was 1st percentile for age,
sex, and race matched control.

2. Normal coronary origin with right dominance.

3. CAD-RADS 0. No evidence of CAD (0%). Consider non-atherosclerotic
causes of chest pain.

4. Evidence of mild ascending aortic dilation, 40 mm. Consider
secondary imaging modality (echocardiogram, CTA Aorta Protocol, MRA
Aorta Protocol) in one year for follow up if clinically indicated.

RECOMMENDATIONS:



If CAC = 0, it is reasonable to withhold statin therapy and reassess
in 5 to 10 years, as long as higher risk conditions are absent
(diabetes mellitus, family history of premature CHD in first degree
relatives (males <55 years; females <65 years), cigarette smoking,
LDL >=190 mg/dL or other independent risk factors).

If CAC is 1 to 99, it is reasonable to initiate statin therapy for
patients >=55 years of age.

If CAC is >=100 or >=75th percentile, it is reasonable to initiate
statin therapy at any age.

Cardiology referral should be considered for patients with CAC
scores =400 or >=75th percentile.

*4418 AHA/ACC/AACVPR/AAPA/ABC/ADMANA/ZABRE/ENGELHARDT/Niicolas/FRANKE/PPOJ/BILLIE
Guideline on the Management of Blood Cholesterol: A Report of the
American College of Cardiology/American Heart Association Task Force
on Clinical Practice Guidelines. J Am Coll Cardiol.
5241;73(24):8820-8052.

*** End of Addendum ***
EXAM:
OVER-READ INTERPRETATION  CT CHEST

The following report is an over-read performed by radiologist Dr.
Rodas Beas [REDACTED] on 04/24/2021. This
over-read does not include interpretation of cardiac or coronary
anatomy or pathology. The coronary calcium score/coronary CTA
interpretation by the cardiologist is attached.
FINDINGS: Ectasia of ascending thoracic aorta (4.0 cm in diameter). Small
calcified granulomas are noted in the right lower lobe. Within the
visualized portions of the thorax there are no suspicious appearing
pulmonary nodules or masses, there is no acute consolidative
airspace disease, no pleural effusions, no pneumothorax and no
lymphadenopathy. Visualized portions of the upper abdomen
demonstrates several calcified granulomas within the liver. There
are no aggressive appearing lytic or blastic lesions noted in the
visualized portions of the skeleton.
IMPRESSION: 1. Ectasia of ascending thoracic aorta (4.0 cm in diameter).
Recommend annual imaging followup by CTA or MRA. This recommendation
follows 7242 ACCF/AHA/AATS/ACR/ASA/SCA/JEGUNDO/ARISSA/GADUNHAS/EUGENIO EDUARDO Guidelines
for the Diagnosis and Management of Patients with Thoracic Aortic
Disease. Circulation. 7242; 121: E266-e369. Aortic aneurysm NOS
(ZE320-XW3.P).
2. Old granulomatous disease, as above.

## 2022-04-16 ENCOUNTER — Ambulatory Visit (HOSPITAL_COMMUNITY): Payer: 59

## 2022-04-26 ENCOUNTER — Ambulatory Visit (HOSPITAL_COMMUNITY): Payer: 59

## 2022-04-27 ENCOUNTER — Inpatient Hospital Stay: Admission: RE | Admit: 2022-04-27 | Payer: 59 | Source: Ambulatory Visit

## 2022-06-08 ENCOUNTER — Encounter (HOSPITAL_COMMUNITY): Payer: Self-pay

## 2022-06-08 ENCOUNTER — Ambulatory Visit (HOSPITAL_COMMUNITY)
Admission: EM | Admit: 2022-06-08 | Discharge: 2022-06-08 | Disposition: A | Discharge status: Home / Self Care | Payer: 59 | Attending: Nurse Practitioner | Admitting: Nurse Practitioner

## 2022-06-08 DIAGNOSIS — Z1152 Encounter for screening for COVID-19: Secondary | ICD-10-CM | POA: Insufficient documentation

## 2022-06-08 DIAGNOSIS — R42 Dizziness and giddiness: Secondary | ICD-10-CM | POA: Insufficient documentation

## 2022-06-08 DIAGNOSIS — J069 Acute upper respiratory infection, unspecified: Secondary | ICD-10-CM | POA: Insufficient documentation

## 2022-06-08 MED ORDER — MECLIZINE HCL 25 MG PO TABS: 25.0000 mg | ORAL_TABLET | Freq: Three times a day (TID) | ORAL | 0 refills | Status: DC | PRN

## 2022-06-08 MED ORDER — GUAIFENESIN 100 MG/5ML PO LIQD: 10.0000 mL | ORAL | 0 refills | Status: DC | PRN

## 2022-06-08 NOTE — ED Provider Notes (Signed)
[facility]    CSN: [id number] Arrival date & time: 06/08/22  1121      History   Chief Complaint Chief Complaint  Patient presents with   Dizziness   Cough    HPI Martin Wright is a 46 y.o. male.   Subjective:   Martin Wright is a 46 y.o. male who presents for evaluation of dizziness. The symptoms started 5 days ago and have been are unchanged. The attacks occur intermittently and last only a few seconds.  Patient notices his symptoms the most when he gets up out of bed in the morning.  He denies any dizziness with rapid head movement or bending down.  No headaches, facial/limb paresthesias or visual disturbance.  No nausea, vomiting, chest pain, shortness of breath, hearing problems or tinnitus. No confusion, drowsiness, headaches, paresthesias, speech change, and visual floaters.   Recent infections: none.  Head trauma: denied.  Drug ingestion: none.  Noise exposure: no occupational exposure.  Family history: non-contributory.  Patient also complains of a "cold" for the past couple of days.  Symptoms include itchy throat, cough and hoarseness.  Denies any fevers, chills, body aches, runny nose, nasal congestion, sore throat, shortness of breath or wheezing.   Patient tried over-the-counter Dramamine for the dizziness which has helped some.  He has not tried anything for his cough.  He denies any known sick contacts.  He has had a recent travel recently as he went to [place] 3 weeks ago.  The following portions of the patient's history were reviewed and updated as appropriate: allergies, current medications, past family history, past medical history, past social history, past surgical history, and problem list.          Past Medical History:  Diagnosis Date   Abdominal pain    Abscess    Ascending aorta dilatation (HCC)    41 mm by echo and 40 mm by chest CT 22   Dyspepsia    GERD (gastroesophageal reflux disease)    H. pylori infection     Hypertension    PFO (patent foramen ovale)    Noted on coronary CTA 04/2021   Precordial chest pain    Viral syndrome     Patient Active Problem List   Diagnosis Date Noted   PFO (patent foramen ovale) 04/25/2021   GERD (gastroesophageal reflux disease) 06/21/2017   Chronic viral hepatitis B without delta-agent (HCC) 06/18/2017    History reviewed. No pertinent surgical history.     Home Medications    Prior to Admission medications   Medication Sig Start Date End Date Taking? Authorizing Provider  guaiFENesin (ROBITUSSIN) 100 MG/5ML liquid Take 10 mLs by mouth every 4 (four) hours as needed for cough or to loosen phlegm. 06/08/22  Yes Lurline IdolMurrill, Darnelle Derrick, FNP  meclizine (ANTIVERT) 25 MG tablet Take 1 tablet (25 mg total) by mouth 3 (three) times daily as needed for dizziness. 06/08/22  Yes Lurline IdolMurrill, Montray Kliebert, FNP  cetirizine (ZYRTEC) 10 MG tablet Take 1 tablet (10 mg total) by mouth daily. 09/14/19 01/27/20  Dahlia ByesBast, Traci A, NP  fluticasone (FLONASE) 50 MCG/ACT nasal spray Place 2 sprays into both nostrils daily. 05/05/19 01/27/20  Eustace MooreNelson, Yvonne Sue, MD  levocetirizine (XYZAL) 5 MG tablet Take 1 tablet (5 mg total) by mouth every evening. 05/05/19 09/14/19  Eustace MooreNelson, Yvonne Sue, MD    Family History Family History  Family history unknown: Yes    Social History Social History   Tobacco Use   Smoking status: Never  Smokeless tobacco: Never  Vaping Use   Vaping Use: Never used  Substance Use Topics   Alcohol use: No   Drug use: No     Allergies   Patient has no known allergies.   Review of Systems Review of Systems  Constitutional:  Negative for activity change, appetite change, chills, diaphoresis, fatigue and fever.  HENT:  Negative for congestion, ear pain, hearing loss, rhinorrhea, sore throat and trouble swallowing.   Eyes:  Negative for visual disturbance.  Respiratory:  Positive for cough. Negative for shortness of breath and wheezing.   Gastrointestinal:   Negative for diarrhea, nausea and vomiting.  Musculoskeletal:  Negative for myalgias.  Neurological:  Positive for dizziness. Negative for weakness, numbness and headaches.  All other systems reviewed and are negative.    Physical Exam Triage Vital Signs ED Triage Vitals [06/08/22 1328]  Enc Vitals Group     BP (!) 144/91     Pulse Rate 60     Resp 12     Temp 98.9 F (37.2 C)     Temp Source Oral     SpO2 96 %     Weight      Height      Head Circumference      Peak Flow      Pain Score 0     Pain Loc      Pain Edu?      Excl. in GC?    No data found.  Updated Vital Signs BP (!) 144/91 (BP Location: Right Arm)   Pulse 60   Temp 98.9 F (37.2 C) (Oral)   Resp 12   SpO2 96%   Visual Acuity Right Eye Distance:   Left Eye Distance:   Bilateral Distance:    Right Eye Near:   Left Eye Near:    Bilateral Near:     Physical Exam Vitals reviewed.  Constitutional:      General: He is not in acute distress.    Appearance: Normal appearance. He is not ill-appearing or toxic-appearing.  HENT:     Head: Normocephalic.     Right Ear: Hearing, tympanic membrane, ear canal and external ear normal. No drainage.     Left Ear: Hearing, tympanic membrane, ear canal and external ear normal. No drainage.     Nose: Nose normal.     Mouth/Throat:     Mouth: Mucous membranes are moist.  Eyes:     General: Vision grossly intact. Gaze aligned appropriately.     Extraocular Movements: Extraocular movements intact.     Right eye: No nystagmus.     Left eye: No nystagmus.     Conjunctiva/sclera: Conjunctivae normal.  Cardiovascular:     Rate and Rhythm: Normal rate and regular rhythm.  Pulmonary:     Effort: Pulmonary effort is normal.     Breath sounds: Normal breath sounds.  Musculoskeletal:        General: Normal range of motion.     Cervical back: Normal range of motion and neck supple.  Lymphadenopathy:     Cervical: No cervical adenopathy.  Skin:    General: Skin is  warm and dry.  Neurological:     General: No focal deficit present.     Mental Status: He is alert and oriented to person, place, and time.     GCS: GCS eye subscore is 4. GCS verbal subscore is 5. GCS motor subscore is 6.     Cranial Nerves: Cranial nerves 2-12 are intact.  No cranial nerve deficit.     Sensory: Sensation is intact.     Motor: Motor function is intact.     Coordination: Coordination is intact.     Gait: Gait is intact.     Comments: Negative dix hallpike maneuver      UC Treatments / Results  Labs (all labs ordered are listed, but only abnormal results are displayed) Labs Reviewed  SARS CORONAVIRUS 2 (TAT 6-24 HRS)    EKG   Radiology No results found.  Procedures Procedures (including critical care time)  Medications Ordered in UC Medications - No data to display  Initial Impression / Assessment and Plan / UC Course  I have reviewed the triage vital signs and the nursing notes.  Pertinent labs & imaging results that were available during my care of the patient were reviewed by me and considered in my medical decision making (see chart for details).      46 year old male presenting with vertigo as well as URI symptoms.  He is afebrile and nontoxic.  Physical exam as above.  No focal deficits noted.  The nature of vertigo syndromes were discussed along with their usual course and treatment. Meclizine and Robitussin ordered. COVID test pending. Educational materials given and questions answered.  Supportive care measures and indications for follow-up discussed.  Today's evaluation has revealed no signs of a dangerous process. Discussed diagnosis with patient and/or guardian. Patient and/or guardian aware of their diagnosis, possible red flag symptoms to watch out for and need for close follow up. Patient and/or guardian understands verbal and written discharge instructions. Patient and/or guardian comfortable with plan and disposition.  Patient and/or  guardian has a clear mental status at this time, good insight into illness (after discussion and teaching) and has clear judgment to make decisions regarding their care  Documentation was completed with the aid of voice recognition software. Transcription may contain typographical errors. Final Clinical Impressions(s) / UC Diagnoses   Final diagnoses:  Vertigo  Viral URI with cough  Encounter for screening for COVID-19     Discharge Instructions      Vertigo: Vertigo is the feeling that you or the things around you are moving when they are not. This feeling can come and go at any time. Take prescribed medications as needed. In the morning, first sit up on the side of the bed. When you feel okay, stand slowly while you hold onto something until you know that your balance is fine. Move slowly. Avoid sudden body or head movements or certain positions, as told by your doctor. Sit down right away if you feel dizzy.  Go to the ED immediately if:  You are always dizzy. You faint. You get very bad headaches. You get a stiff neck. Bright light starts to bother you. You have trouble moving or talking. You feel weak in your hands, arms, or legs. You have changes in your hearing or in how you see  Cough: Your other symptoms are likely due to a viral respiratory infection. A respiratory infection is an illness that affects part of the respiratory system, such as the lungs, nose, or throat. Antibiotic medicines are not prescribed for viral infections. This is because antibiotics are designed to kill bacteria. They do not kill viruses.  Take prescribed medications for cough as needed. You may take tylenol or ibuprofen as needed for any fevers/headache/body aches. Drink plenty of fluids. Stay in home isolation until you receive results of your COVID test. You will only be notified  for positive results. You may go online to MyChart and review your results.      ED Prescriptions     Medication Sig  Dispense Auth. Provider   meclizine (ANTIVERT) 25 MG tablet Take 1 tablet (25 mg total) by mouth 3 (three) times daily as needed for dizziness. 30 tablet Lurline Idol, FNP   guaiFENesin (ROBITUSSIN) 100 MG/5ML liquid Take 10 mLs by mouth every 4 (four) hours as needed for cough or to loosen phlegm. 60 mL Lurline Idol, FNP      PDMP not reviewed this encounter.   Lurline Idol, [place] 06/08/22 1515

## 2022-06-08 NOTE — ED Triage Notes (Signed)
Pt is here for itchy throat, cough, dizziness, pharyngitis, loss of appetite x 3days

## 2022-06-08 NOTE — Discharge Instructions (Addendum)
Vertigo: Vertigo is the feeling that you or the things around you are moving when they are not. This feeling can come and go at any time. Take prescribed medications as needed. In the morning, first sit up on the side of the bed. When you feel okay, stand slowly while you hold onto something until you know that your balance is fine. Move slowly. Avoid sudden body or head movements or certain positions, as told by your doctor. Sit down right away if you feel dizzy.  Go to the ED immediately if:  You are always dizzy. You faint. You get very bad headaches. You get a stiff neck. Bright light starts to bother you. You have trouble moving or talking. You feel weak in your hands, arms, or legs. You have changes in your hearing or in how you see  Cough: Your other symptoms are likely due to a viral respiratory infection. A respiratory infection is an illness that affects part of the respiratory system, such as the lungs, nose, or throat. Antibiotic medicines are not prescribed for viral infections. This is because antibiotics are designed to kill bacteria. They do not kill viruses.  Take prescribed medications for cough as needed. You may take tylenol or ibuprofen as needed for any fevers/headache/body aches. Drink plenty of fluids. Stay in home isolation until you receive results of your COVID test. You will only be notified for positive results. You may go online to MyChart and review your results.

## 2022-06-09 LAB — SARS CORONAVIRUS 2 (TAT 6-24 HRS): SARS Coronavirus 2: NEGATIVE

## 2022-11-19 ENCOUNTER — Encounter (INDEPENDENT_AMBULATORY_CARE_PROVIDER_SITE_OTHER): Payer: Self-pay | Admitting: Primary Care

## 2022-11-19 ENCOUNTER — Ambulatory Visit (INDEPENDENT_AMBULATORY_CARE_PROVIDER_SITE_OTHER): Payer: Medicaid Other | Admitting: Primary Care

## 2022-11-19 VITALS — BP 141/90 | HR 68 | Resp 16 | Ht 65.0 in | Wt 140.6 lb

## 2022-11-19 DIAGNOSIS — Z760 Encounter for issue of repeat prescription: Secondary | ICD-10-CM

## 2022-11-19 DIAGNOSIS — G4700 Insomnia, unspecified: Secondary | ICD-10-CM | POA: Diagnosis not present

## 2022-11-19 DIAGNOSIS — Z1211 Encounter for screening for malignant neoplasm of colon: Secondary | ICD-10-CM

## 2022-11-19 DIAGNOSIS — I10 Essential (primary) hypertension: Secondary | ICD-10-CM | POA: Diagnosis not present

## 2022-11-19 DIAGNOSIS — Z0000 Encounter for general adult medical examination without abnormal findings: Secondary | ICD-10-CM | POA: Diagnosis not present

## 2022-11-19 MED ORDER — HYDROCHLOROTHIAZIDE 25 MG PO TABS: 25.0000 mg | ORAL_TABLET | Freq: Every day | ORAL | 1 refills | Status: DC

## 2022-11-19 NOTE — Progress Notes (Signed)
[facility] Patient presents to clinic today for annual exam.  Patient is not fasting for labs.  Acute Concerns: Insomnia , anorexia   Chronic Issues: Past Medical History:  Diagnosis Date   Abdominal pain    Abscess    Ascending aorta dilatation (HCC)    41 mm by echo and 40 mm by chest CT 22   Dyspepsia    GERD (gastroesophageal reflux disease)    H. pylori infection    Hypertension    PFO (patent foramen ovale)    Noted on coronary CTA 04/2021   Precordial chest pain    Viral syndrome      Health Maintenance: Immunizations -- UTD Colon Cancer Screening -- ordered  HIV/Hep C Screening --   Past Medical History:  Diagnosis Date   Abdominal pain    Abscess    Ascending aorta dilatation (HCC)    41 mm by echo and 40 mm by chest CT 22   Dyspepsia    GERD (gastroesophageal reflux disease)    H. pylori infection    Hypertension    PFO (patent foramen ovale)    Noted on coronary CTA 04/2021   Precordial chest pain    Viral syndrome     No past surgical history on file.  Current Outpatient Medications on File Prior to Visit  Medication Sig Dispense Refill   guaiFENesin (ROBITUSSIN) 100 MG/5ML liquid Take 10 mLs by mouth every 4 (four) hours as needed for cough or to loosen phlegm. (Patient not taking: Reported on 11/19/2022) 60 mL 0   meclizine (ANTIVERT) 25 MG tablet Take 1 tablet (25 mg total) by mouth 3 (three) times daily as needed for dizziness. (Patient not taking: Reported on 11/19/2022) 30 tablet 0   [DISCONTINUED] cetirizine (ZYRTEC) 10 MG tablet Take 1 tablet (10 mg total) by mouth daily. 30 tablet 0   [DISCONTINUED] fluticasone (FLONASE) 50 MCG/ACT nasal spray Place 2 sprays into both nostrils daily. 16 g 2   [DISCONTINUED] levocetirizine (XYZAL) 5 MG tablet Take 1 tablet (5 mg total) by mouth every evening. 14 tablet 0   No current facility-administered medications on file prior to visit.    No Known Allergies  Family History   Family history unknown: Yes    Social History   Socioeconomic History   Marital status: Married    Spouse name: Not on file   Number of children: Not on file   Years of education: Not on file   Highest education level: Not on file  Occupational History   Not on file  Tobacco Use   Smoking status: Never   Smokeless tobacco: Never  Vaping Use   Vaping Use: Never used  Substance and Sexual Activity   Alcohol use: No   Drug use: No   Sexual activity: Yes    Partners: Female  Other Topics Concern   Not on file  Social History Narrative   Not on file   Social Determinants of Health   Financial Resource Strain: Not on file  Food Insecurity: Not on file  Transportation Needs: Not on file  Physical Activity: Not on file  Stress: Not on file  Social Connections: Not on file  Intimate Partner Violence: Not on file    ROS Comprehensive ROS Pertinent positive and negative noted in HPI   Blood Pressure (Abnormal) 141/90 (BP Location: Right Arm, Patient Position: Sitting, Cuff Size: Normal)   Pulse 68   Respiration 16   Height 5\' 5"  (1.651 m)  Weight 140 lb 9.6 oz (63.8 kg)   Oxygen Saturation 98%   Body Mass Index 23.40 kg/m   Physical Exam Vitals reviewed.  Constitutional:      Appearance: Normal appearance. He is normal weight.  HENT:     Head: Normocephalic.     Right Ear: Tympanic membrane and external ear normal.     Left Ear: Tympanic membrane and external ear normal.     Nose: Nose normal.  Eyes:     Extraocular Movements: Extraocular movements intact.     Pupils: Pupils are equal, round, and reactive to light.  Cardiovascular:     Rate and Rhythm: Normal rate and regular rhythm.  Pulmonary:     Effort: Pulmonary effort is normal.     Breath sounds: Normal breath sounds.  Abdominal:     General: Bowel sounds are normal.     Palpations: Abdomen is soft.  Musculoskeletal:        General: Normal range of motion.     Cervical back: Normal range of  motion.  Skin:    General: Skin is warm and dry.  Neurological:     Mental Status: He is alert and oriented to person, place, and time.  Psychiatric:        Mood and Affect: Mood normal.        Behavior: Behavior normal.   No results found for this or any previous visit (from the past 2160 hour(s)).  Assessment/Plan: Martin Wright was seen today for annual exam, insomnia and anorexia.  Diagnoses and all orders for this visit:  Colon cancer screening -     Ambulatory referral to Gastroenterology  Annual physical exam Completed   Insomnia, unspecified type Information on AVS if no improvement re-evaluate  Essential hypertension BP goal - < 130/80 Explained that having normal blood pressure is the goal and medications are helping to get to goal and maintain normal blood pressure. DIET: Limit salt intake, read nutrition labels to check salt content, limit fried and high fatty foods  Avoid using multisymptom OTC cold preparations that generally contain sudafed which can rise BP. Consult with pharmacist on best cold relief products to use for persons with HTN EXERCISE Discussed incorporating exercise such as walking - 30 minutes most days of the week and can do in 10 minute intervals     Other orders 2/2 Medication refill  -     hydrochlorothiazide (HYDRODIURIL) 25 MG tablet; Take 1 tablet (25 mg total) by mouth daily.    This note has been created with Education officer, environmental. Any transcriptional errors are unintentional.   Grayce Sessions, NP 11/19/2022, 5:01 PM

## 2022-11-19 NOTE — Patient Instructions (Signed)
Insomnia Insomnia is a sleep disorder that makes it difficult to fall asleep or stay asleep. Insomnia can cause fatigue, low energy, difficulty concentrating, mood swings, and poor performance at work or school. There are three different ways to classify insomnia: Difficulty falling asleep. Difficulty staying asleep. Waking up too early in the morning. Any type of insomnia can be long-term (chronic) or short-term (acute). Both are common. Short-term insomnia usually lasts for 3 months or less. Chronic insomnia occurs at least three times a week for longer than 3 months. What are the causes? Insomnia may be caused by another condition, situation, or substance, such as: Having certain mental health conditions, such as anxiety and depression. Using caffeine, alcohol, tobacco, or drugs. Having gastrointestinal conditions, such as gastroesophageal reflux disease (GERD). Having certain medical conditions. These include: Asthma. Alzheimer's disease. Stroke. Chronic pain. An overactive thyroid gland (hyperthyroidism). Other sleep disorders, such as restless legs syndrome and sleep apnea. Menopause. Sometimes, the cause of insomnia may not be known. What increases the risk? Risk factors for insomnia include: Gender. Females are affected more often than males. Age. Insomnia is more common as people get older. Stress and certain medical and mental health conditions. Lack of exercise. Having an irregular work schedule. This may include working night shifts and traveling between different time zones. What are the signs or symptoms? If you have insomnia, the main symptom is having trouble falling asleep or having trouble staying asleep. This may lead to other symptoms, such as: Feeling tired or having low energy. Feeling nervous about going to sleep. Not feeling rested in the morning. Having trouble concentrating. Feeling irritable, anxious, or depressed. How is this diagnosed? This condition  may be diagnosed based on: Your symptoms and medical history. Your health care provider may ask about: Your sleep habits. Any medical conditions you have. Your mental health. A physical exam. How is this treated? Treatment for insomnia depends on the cause. Treatment may focus on treating an underlying condition that is causing the insomnia. Treatment may also include: Medicines to help you sleep. Counseling or therapy. Lifestyle adjustments to help you sleep better. Follow these instructions at home: Eating and drinking  Limit or avoid alcohol, caffeinated beverages, and products that contain nicotine and tobacco, especially close to bedtime. These can disrupt your sleep. Do not eat a large meal or eat spicy foods right before bedtime. This can lead to digestive discomfort that can make it hard for you to sleep. Sleep habits  Keep a sleep diary to help you and your health care provider figure out what could be causing your insomnia. Write down: When you sleep. When you wake up during the night. How well you sleep and how rested you feel the next day. Any side effects of medicines you are taking. What you eat and drink. Make your bedroom a dark, comfortable place where it is easy to fall asleep. Put up shades or blackout curtains to block light from outside. Use a white noise machine to block noise. Keep the temperature cool. Limit screen use before bedtime. This includes: Not watching TV. Not using your smartphone, tablet, or computer. Stick to a routine that includes going to bed and waking up at the same times every day and night. This can help you fall asleep faster. Consider making a quiet activity, such as reading, part of your nighttime routine. Try to avoid taking naps during the day so that you sleep better at night. Get out of bed if you are still awake after   15 minutes of trying to sleep. Keep the lights down, but try reading or doing a quiet activity. When you feel  sleepy, go back to bed. General instructions Take over-the-counter and prescription medicines only as told by your health care provider. Exercise regularly as told by your health care provider. However, avoid exercising in the hours right before bedtime. Use relaxation techniques to manage stress. Ask your health care provider to suggest some techniques that may work well for you. These may include: Breathing exercises. Routines to release muscle tension. Visualizing peaceful scenes. Make sure that you drive carefully. Do not drive if you feel very sleepy. Keep all follow-up visits. This is important. Contact a health care provider if: You are tired throughout the day. You have trouble in your daily routine due to sleepiness. You continue to have sleep problems, or your sleep problems get worse. Get help right away if: You have thoughts about hurting yourself or someone else. Get help right away if you feel like you may hurt yourself or others, or have thoughts about taking your own life. Go to your nearest emergency room or: Call 911. Call the National Suicide Prevention Lifeline at 1-800-273-8255 or 988. This is open 24 hours a day. Text the Crisis Text Line at 741741. Summary Insomnia is a sleep disorder that makes it difficult to fall asleep or stay asleep. Insomnia can be long-term (chronic) or short-term (acute). Treatment for insomnia depends on the cause. Treatment may focus on treating an underlying condition that is causing the insomnia. Keep a sleep diary to help you and your health care provider figure out what could be causing your insomnia. This information is not intended to replace advice given to you by your health care provider. Make sure you discuss any questions you have with your health care provider. Document Revised: 05/08/2021 Document Reviewed: 05/08/2021 Elsevier Patient Education  2024 [employer] Inc[employer]  

## 2022-11-20 ENCOUNTER — Ambulatory Visit (INDEPENDENT_AMBULATORY_CARE_PROVIDER_SITE_OTHER): Payer: Medicaid Other

## 2022-12-03 ENCOUNTER — Ambulatory Visit (INDEPENDENT_AMBULATORY_CARE_PROVIDER_SITE_OTHER): Payer: Medicaid Other

## 2022-12-14 ENCOUNTER — Telehealth (INDEPENDENT_AMBULATORY_CARE_PROVIDER_SITE_OTHER): Payer: Self-pay

## 2022-12-14 NOTE — Telephone Encounter (Signed)
Copied from CRM [id number]. Topic: General - Other >> Dec 14, 2022  1:35 PM Lennox Pippins wrote: Patient called and wanted a call back in regards to he was questioning why a referral was placed for him. I asked him was it the GI referral and patient did not know to where and also was unaware PCP was placing a referral and questioning why.  Patients callback #: [phone]

## 2022-12-14 NOTE — Telephone Encounter (Signed)
Returned pt call and left a detailed vm making pt aware that this was addressed during his ov on 6/10 and provider placed referral for GI to get a colonoscopy done and if her has any questions or concerns to give [place] a call

## 2022-12-17 ENCOUNTER — Telehealth: Payer: Self-pay | Admitting: Primary Care

## 2022-12-17 NOTE — Telephone Encounter (Signed)
Returned pt call on 12/14/22 and pt didn't answer. Pt is requesting a call back from provider

## 2022-12-17 NOTE — Telephone Encounter (Signed)
Copied from CRM [id number]. Topic: General - Other >> Dec 14, 2022  3:30 PM [name] wrote: Reason for CRM: Pt is calling back requesting to speak with his PCP Gwinda Passe). Pt did not state why he is wanting to speak with his PCP, but is requesting a call back from her so that he can talk to her. Please advise.

## 2023-02-18 ENCOUNTER — Telehealth (INDEPENDENT_AMBULATORY_CARE_PROVIDER_SITE_OTHER): Payer: Self-pay | Admitting: Primary Care

## 2023-02-18 NOTE — Telephone Encounter (Signed)
Medication Refill - Medication: hydrochlorothiazide (HYDRODIURIL) 25 MG tablet   Pt states that he has been out of his medication for two weeks.    Has the patient contacted their pharmacy? No.  Preferred Pharmacy (with phone number or street name): [id number] - [place], [place] - [address]  Phone: [phone] Fax: [phone]  Has the patient been seen for an appointment in the last year OR does the patient have an upcoming appointment? Yes.    Agent: Please be advised that RX refills may take up to 3 business days. We ask that you follow-up with your pharmacy.

## 2023-02-18 NOTE — Telephone Encounter (Signed)
[facility] called and spoke to Dois Davenport, Pensions consultant about the refill(s) hydrochlorothiazide requested. Advised it was sent on 11/19/22 #90/1 refill(s). She says they have it and can refill it for him.

## 2024-02-19 ENCOUNTER — Ambulatory Visit (INDEPENDENT_AMBULATORY_CARE_PROVIDER_SITE_OTHER): Admitting: Primary Care

## 2024-02-19 ENCOUNTER — Encounter (INDEPENDENT_AMBULATORY_CARE_PROVIDER_SITE_OTHER): Payer: Self-pay | Admitting: Primary Care

## 2024-02-19 VITALS — BP 137/81 | HR 62 | Temp 98.5°F | Resp 14 | Ht 65.0 in | Wt 148.0 lb

## 2024-02-19 DIAGNOSIS — R0989 Other specified symptoms and signs involving the circulatory and respiratory systems: Secondary | ICD-10-CM | POA: Diagnosis not present

## 2024-02-19 DIAGNOSIS — I10 Essential (primary) hypertension: Secondary | ICD-10-CM

## 2024-02-19 DIAGNOSIS — Z760 Encounter for issue of repeat prescription: Secondary | ICD-10-CM

## 2024-02-19 DIAGNOSIS — Z1211 Encounter for screening for malignant neoplasm of colon: Secondary | ICD-10-CM

## 2024-02-19 MED ORDER — GUAIFENESIN ER 600 MG PO TB12: 600.0000 mg | ORAL_TABLET | Freq: Two times a day (BID) | ORAL | 1 refills | Status: AC

## 2024-02-19 MED ORDER — HYDROCHLOROTHIAZIDE 25 MG PO TABS: 25.0000 mg | ORAL_TABLET | Freq: Every day | ORAL | 1 refills | Status: AC

## 2024-02-19 NOTE — Progress Notes (Signed)
 [facility]   Martin Wright is a 47 y.o. male presents for hypertension evaluation, Denies shortness of breath, headaches, chest pain or lower extremity edema, sudden onset, vision changes, unilateral weakness, dizziness, paresthesias   Patient denies adherence with medications. Bp meds out   Dietary habits include: native food Exercise habits include:yes     Past Medical History:  Diagnosis Date   Abdominal pain    Abscess    Ascending aorta dilatation (HCC)    41 mm by echo and 40 mm by chest CT 22   Dyspepsia    GERD (gastroesophageal reflux disease)    H. pylori infection    Hypertension    PFO (patent foramen ovale)    Noted on coronary CTA 04/2021   Precordial chest pain    Viral syndrome    No past surgical history on file. No Known Allergies Current Outpatient Medications on File Prior to Visit  Medication Sig Dispense Refill   [DISCONTINUED] cetirizine  (ZYRTEC ) 10 MG tablet Take 1 tablet (10 mg total) by mouth daily. 30 tablet 0   [DISCONTINUED] fluticasone  (FLONASE ) 50 MCG/ACT nasal spray Place 2 sprays into both nostrils daily. 16 g 2   No current facility-administered medications on file prior to visit.   Social History   Socioeconomic History   Marital status: Married    Spouse name: Not on file   Number of children: Not on file   Years of education: Not on file   Highest education level: Not on file  Occupational History   Not on file  Tobacco Use   Smoking status: Never   Smokeless tobacco: Never  Vaping Use   Vaping status: Never Used  Substance and Sexual Activity   Alcohol use: No   Drug use: No   Sexual activity: Yes    Partners: Female  Other Topics Concern   Not on file  Social History Narrative   Not on file   Social Drivers of Health   Financial Resource Strain: Not on file  Food Insecurity: Not on file  Transportation Needs: Not on file  Physical Activity: Not on file  Stress: Not on file  Social  Connections: Not on file  Intimate Partner Violence: Not on file   Family History  Family history unknown: Yes   Health Maintenance  Topic Date Due   Pneumococcal Vaccine (1 of 2 - PCV) Never done   Hepatitis B Vaccines 19-59 Average Risk (1 of 3 - 19+ 3-dose series) 11/22/1994   Colonoscopy  Never done   Influenza Vaccine  01/10/2024   COVID-19 Vaccine (1 - 2024-25 season) Never done   DTaP/Tdap/Td (2 - Td or Tdap) 06/06/2027   Hepatitis C Screening  Completed   HIV Screening  Completed   HPV VACCINES  Aged Out   Meningococcal B Vaccine  Aged Out     OBJECTIVE:  Vitals:   02/19/24 1547 02/19/24 1603  BP: (!) 146/95 137/81  Pulse: 62   Resp: 14   Temp: 98.5 F (36.9 C)   TempSrc: Oral   SpO2: 99%   Weight: 148 lb (67.1 kg)   Height: 5' 5 (1.651 m)     Physical Exam Vitals reviewed.  Constitutional:      Appearance: Normal appearance.  HENT:     Head: Normocephalic.     Right Ear: Tympanic membrane and external ear normal.     Left Ear: Tympanic membrane and external ear normal.     Nose: Nose normal.  Eyes:  Extraocular Movements: Extraocular movements intact.     Pupils: Pupils are equal, round, and reactive to light.  Cardiovascular:     Rate and Rhythm: Normal rate and regular rhythm.  Pulmonary:     Effort: Pulmonary effort is normal.     Breath sounds: Normal breath sounds.  Abdominal:     General: Bowel sounds are normal. There is distension.     Palpations: Abdomen is soft.  Musculoskeletal:        General: Normal range of motion.     Cervical back: Normal range of motion.  Skin:    General: Skin is warm and dry.  Neurological:     Mental Status: He is alert and oriented to person, place, and time.  Psychiatric:        Mood and Affect: Mood normal.        Behavior: Behavior normal.        Thought Content: Thought content normal.        Judgment: Judgment normal.      ROS  Last 3 Office BP readings: BP Readings from Last 3  Encounters:  02/19/24 137/81  11/19/22 (!) 141/90  06/08/22 (!) 144/91    BMET    Component Value Date/Time   NA 137 12/23/2021 0631   NA 138 04/11/2021 0923   K 3.1 (L) 12/23/2021 0631   CL 101 12/23/2021 0631   CO2 23 12/23/2021 0631   GLUCOSE 149 (H) 12/23/2021 0631   BUN 8 12/23/2021 0631   BUN 12 04/11/2021 0923   CREATININE 1.07 12/23/2021 0631   CALCIUM 9.0 12/23/2021 0631   GFRNONAA >60 12/23/2021 0631   GFRAA 82 05/07/2017 1040    Renal function: CrCl cannot be calculated (Patient's most recent lab result is older than the maximum 21 days allowed.).  Clinical ASCVD: No  The ASCVD Risk score (Arnett DK, et al., 2019) failed to calculate for the following reasons:   Cannot find a previous HDL lab   Cannot find a previous total cholesterol lab  ASCVD risk factors include- ITALY   ASSESSMENT & PLAN:  Meds ordered this encounter  Medications   hydrochlorothiazide  (HYDRODIURIL ) 25 MG tablet    Sig: Take 1 tablet (25 mg total) by mouth daily.    Dispense:  90 tablet    Refill:  1    Supervising Provider:   JEGEDE, OLUGBEMIGA E [[id number]]   Martin Wright was seen today for follow-up.  Diagnoses and all orders for this visit:  Essential hypertension BP goal - < 130/80 Explained that having normal blood pressure is the goal and medications are helping to get to goal and maintain normal blood pressure. DIET: Limit salt intake, read nutrition labels to check salt content, limit fried and high fatty foods  Avoid using multisymptom OTC cold preparations that generally contain sudafed which can rise BP. Consult with pharmacist on best cold relief products to use for persons with HTN EXERCISE Discussed incorporating exercise such as walking - 30 minutes most days of the week and can do in 10 minute intervals    Goal BP:  For patients younger than 60: Goal BP < 130/80. For patients 60 and older: Goal BP < 140/90. For patients with diabetes: Goal BP < 130/80. Your most  recent BP: 137/81  Minimize salt intake. Minimize alcohol intake   -     hydrochlorothiazide  (HYDRODIURIL ) 25 MG tablet; Take 1 tablet (25 mg total) by mouth daily.  Medication refill -     hydrochlorothiazide  (HYDRODIURIL ) 25  MG tablet; Take 1 tablet (25 mg total) by mouth daily.  Colon cancer screening -     Ambulatory referral to Gastroenterology  Phlegm in throat -     guaiFENesin  (MUCINEX ) 600 MG 12 hr tablet; Take 1 tablet (600 mg total) by mouth 2 (two) times daily.   This note has been created with Education officer, environmental. Any transcriptional errors are unintentional.   Rosaline SHAUNNA Bohr, NP 02/19/2024, 4:13 PM

## 2024-02-29 ENCOUNTER — Encounter (HOSPITAL_COMMUNITY): Payer: Self-pay

## 2024-02-29 ENCOUNTER — Other Ambulatory Visit: Payer: Self-pay

## 2024-02-29 ENCOUNTER — Emergency Department (HOSPITAL_COMMUNITY)

## 2024-02-29 ENCOUNTER — Emergency Department (HOSPITAL_COMMUNITY)
Admission: EM | Admit: 2024-02-29 | Discharge: 2024-02-29 | Disposition: A | Discharge status: Home / Self Care | Attending: Emergency Medicine | Admitting: Emergency Medicine

## 2024-02-29 DIAGNOSIS — R42 Dizziness and giddiness: Secondary | ICD-10-CM | POA: Diagnosis present

## 2024-02-29 LAB — CBC WITH DIFFERENTIAL/PLATELET
Abs Immature Granulocytes: 0.02 K/uL (ref 0.00–0.07)
Basophils Absolute: 0 K/uL (ref 0.0–0.1)
Basophils Relative: 1 %
Eosinophils Absolute: 0.5 K/uL (ref 0.0–0.5)
Eosinophils Relative: 8 %
HCT: 50.4 % (ref 39.0–52.0)
Hemoglobin: 16.8 g/dL (ref 13.0–17.0)
Immature Granulocytes: 0 %
Lymphocytes Relative: 22 %
Lymphs Abs: 1.2 K/uL (ref 0.7–4.0)
MCH: 28.8 pg (ref 26.0–34.0)
MCHC: 33.3 g/dL (ref 30.0–36.0)
MCV: 86.4 fL (ref 80.0–100.0)
Monocytes Absolute: 0.5 K/uL (ref 0.1–1.0)
Monocytes Relative: 9 %
Neutro Abs: 3.4 K/uL (ref 1.7–7.7)
Neutrophils Relative %: 60 %
Platelets: 204 K/uL (ref 150–400)
RBC: 5.83 MIL/uL — ABNORMAL HIGH (ref 4.22–5.81)
RDW: 11.6 % (ref 11.5–15.5)
WBC: 5.7 K/uL (ref 4.0–10.5)
nRBC: 0 % (ref 0.0–0.2)

## 2024-02-29 LAB — URINALYSIS, ROUTINE W REFLEX MICROSCOPIC
Bilirubin Urine: NEGATIVE
Glucose, UA: NEGATIVE mg/dL
Hgb urine dipstick: NEGATIVE
Ketones, ur: NEGATIVE mg/dL
Leukocytes,Ua: NEGATIVE
Nitrite: NEGATIVE
Protein, ur: NEGATIVE mg/dL
Specific Gravity, Urine: 1.01 (ref 1.005–1.030)
pH: 8 (ref 5.0–8.0)

## 2024-02-29 LAB — COMPREHENSIVE METABOLIC PANEL WITH GFR
ALT: 19 U/L (ref 0–44)
AST: 20 U/L (ref 15–41)
Albumin: 4.3 g/dL (ref 3.5–5.0)
Alkaline Phosphatase: 55 U/L (ref 38–126)
Anion gap: 9 (ref 5–15)
BUN: 7 mg/dL (ref 6–20)
CO2: 27 mmol/L (ref 22–32)
Calcium: 9.3 mg/dL (ref 8.9–10.3)
Chloride: 100 mmol/L (ref 98–111)
Creatinine, Ser: 1.04 mg/dL (ref 0.61–1.24)
GFR, Estimated: >60 mL/min (ref >60)
Glucose, Bld: 106 mg/dL — ABNORMAL HIGH (ref 70–99)
Potassium: 3.4 mmol/L — ABNORMAL LOW (ref 3.5–5.1)
Sodium: 136 mmol/L (ref 135–145)
Total Bilirubin: 1.2 mg/dL (ref 0.0–1.2)
Total Protein: 7.6 g/dL (ref 6.5–8.1)

## 2024-02-29 LAB — RESP PANEL BY RT-PCR (RSV, FLU A&B, COVID)  RVPGX2
Influenza A by PCR: NEGATIVE
Influenza B by PCR: NEGATIVE
Resp Syncytial Virus by PCR: NEGATIVE
SARS Coronavirus 2 by RT PCR: NEGATIVE

## 2024-02-29 LAB — CBG MONITORING, ED: Glucose-Capillary: 110 mg/dL — ABNORMAL HIGH (ref 70–99)

## 2024-02-29 LAB — TROPONIN I (HIGH SENSITIVITY)
Troponin I (High Sensitivity): 3 ng/L (ref <18)
Troponin I (High Sensitivity): 3 ng/L (ref <18)

## 2024-02-29 MED ORDER — MECLIZINE HCL 25 MG PO TABS: 25.0000 mg | ORAL_TABLET | Freq: Three times a day (TID) | ORAL | 0 refills | Status: AC | PRN

## 2024-02-29 MED ORDER — MECLIZINE HCL 25 MG PO TABS
25.0000 mg | ORAL_TABLET | Freq: Once | ORAL | Status: AC
  Administered 2024-02-29: 25 mg via ORAL
  Filled 2024-02-29: qty 1

## 2024-02-29 MED ORDER — SODIUM CHLORIDE 0.9 % IV BOLUS
1000.0000 mL | Freq: Once | INTRAVENOUS | Status: AC
  Administered 2024-02-29: 1000 mL via INTRAVENOUS

## 2024-02-29 NOTE — ED Provider Notes (Signed)
 [place] EMERGENCY DEPARTMENT AT [facility] Provider Note   CSN: [id number] Arrival date & time: 02/29/24  9074     Patient presents with: Dizziness   Martin Wright is a 48 y.o. male.   Patient complains of dizziness.  Patient states that symptoms began this a.m.  Patient tells me that he had the same 2 years ago.  Patient denies any areas of weakness.  Patient states he has not had any nausea or vomiting he has not been around anyone who has been sick.  Patient denies any chest pain he has not had any abdominal pain.  Patient denies any difficulty walking he has no weakness in any extremity.  Patient denies any visual changes he has not had any hearing changes.  The history is provided by the spouse.  Dizziness      Prior to Admission medications   Medication Sig Start Date End Date Taking? Authorizing Provider  guaiFENesin  (MUCINEX ) 600 MG 12 hr tablet Take 1 tablet (600 mg total) by mouth 2 (two) times daily. Patient taking differently: Take 600 mg by mouth daily. 02/19/24  Yes Celestia Rosaline SQUIBB, NP  hydrochlorothiazide  (HYDRODIURIL ) 25 MG tablet Take 1 tablet (25 mg total) by mouth daily. 02/19/24  Yes Celestia Rosaline SQUIBB, NP  meclizine  (ANTIVERT ) 25 MG tablet Take 1 tablet (25 mg total) by mouth 3 (three) times daily as needed for dizziness. 02/29/24  Yes Raveena Hebdon K, PA-C  cetirizine  (ZYRTEC ) 10 MG tablet Take 1 tablet (10 mg total) by mouth daily. 09/14/19 01/27/20  Adah Wilbert LABOR, FNP  fluticasone  (FLONASE ) 50 MCG/ACT nasal spray Place 2 sprays into both nostrils daily. 05/05/19 01/27/20  Maranda Jamee Jacob, MD    Allergies: Patient has no known allergies.    Review of Systems  Neurological:  Positive for dizziness.  All other systems reviewed and are negative.   Updated Vital Signs BP (!) 177/101   Pulse (!) 57   Temp 97.9 F (36.6 C) (Oral)   Resp 14   Ht 5' 5 (1.651 m)   Wt 63.5 kg   SpO2 100%   BMI 23.30 kg/m   Physical Exam Vitals  and nursing note reviewed.  Constitutional:      Appearance: He is well-developed.  HENT:     Head: Normocephalic.     Right Ear: External ear normal.     Left Ear: External ear normal.     Nose: Nose normal.     Mouth/Throat:     Mouth: Mucous membranes are moist.  Eyes:     Extraocular Movements: Extraocular movements intact.     Conjunctiva/sclera: Conjunctivae normal.     Pupils: Pupils are equal, round, and reactive to light.  Cardiovascular:     Rate and Rhythm: Normal rate.  Pulmonary:     Effort: Pulmonary effort is normal.  Abdominal:     General: Abdomen is flat. There is no distension.  Musculoskeletal:        General: Normal range of motion.     Cervical back: Normal range of motion.  Skin:    General: Skin is warm.  Neurological:     General: No focal deficit present.     Mental Status: He is alert and oriented to person, place, and time.     Cranial Nerves: No cranial nerve deficit.  Psychiatric:        Mood and Affect: Mood normal.     (all labs ordered are listed, but only abnormal results are  displayed) Labs Reviewed  CBC WITH DIFFERENTIAL/PLATELET - Abnormal; Notable for the following components:      Result Value   RBC 5.83 (*)    All other components within normal limits  COMPREHENSIVE METABOLIC PANEL WITH GFR - Abnormal; Notable for the following components:   Potassium 3.4 (*)    Glucose, Bld 106 (*)    All other components within normal limits  URINALYSIS, ROUTINE W REFLEX MICROSCOPIC - Abnormal; Notable for the following components:   Color, Urine STRAW (*)    All other components within normal limits  CBG MONITORING, ED - Abnormal; Notable for the following components:   Glucose-Capillary 110 (*)    All other components within normal limits  RESP PANEL BY RT-PCR (RSV, FLU A&B, COVID)  [id number]  TROPONIN I (HIGH SENSITIVITY)  TROPONIN I (HIGH SENSITIVITY)    EKG: EKG Interpretation Date/Time:  Saturday February 29 2024 09:32:51  EDT Ventricular Rate:  59 PR Interval:  174 QRS Duration:  95 QT Interval:  415 QTC Calculation: 412 R Axis:   -58  Text Interpretation: Sinus rhythm Left anterior fascicular block Abnormal R-wave progression, early transition Abnormal T, consider ischemia, diffuse leads Borderline ST elevation, anterior leads No significant change since last tracing Confirmed by Dean Clarity [phone]) on 02/29/2024 9:35:17 AM  Radiology: CT Head Wo Contrast Result Date: 02/29/2024 CLINICAL DATA:  48 year old male with weakness and dizziness upon waking. Hypertensive. EXAM: CT HEAD WITHOUT CONTRAST TECHNIQUE: Contiguous axial images were obtained from the base of the skull through the vertex without intravenous contrast. RADIATION DOSE REDUCTION: This exam was performed according to the departmental dose-optimization program which includes automated exposure control, adjustment of the mA and/or kV according to patient size and/or use of iterative reconstruction technique. COMPARISON:  Head CT 12/23/2021. FINDINGS: Brain: Small cavum septum pellucidum, normal variant. Cerebral volume remains normal. No midline shift, ventriculomegaly, mass effect, evidence of mass lesion, intracranial hemorrhage or evidence of cortically based acute infarction. Gray-white matter differentiation is within normal limits throughout the brain. Vascular: No suspicious intracranial vascular hyperdensity. Faint Calcified atherosclerosis at the skull base. Skull: Intact, negative. Sinuses/Orbits: Scattered mild to moderate bilateral paranasal sinus mucosal thickening is unchanged since 2023. No visible sinus fluid levels. Tympanic cavities and mastoids remain clear. Other: Visualized orbits and scalp soft tissues are within normal limits. IMPRESSION: 1. Stable and normal noncontrast CT appearance of the brain. 2. Chronic paranasal sinus disease stable since 2023. Electronically Signed   By: VEAR Hurst M.D.   On: 02/29/2024 10:32     Procedures    Medications Ordered in the ED  sodium chloride  0.9 % bolus 1,000 mL (0 mLs Intravenous Stopped 02/29/24 1046)  meclizine  (ANTIVERT ) tablet 25 mg (25 mg Oral Given 02/29/24 1007)                                    Medical Decision Making Patient complains of dizziness.  Patient feels dizzy with standing up.  Amount and/or Complexity of Data Reviewed Independent Historian: spouse    Details: Patient is here with his wife who reports patient has a history of the same External Data Reviewed: notes.    Details: Previous notes reviewed patient has had vertigo in the past Labs: ordered. Decision-making details documented in ED Course. Radiology: ordered and independent interpretation performed. Decision-making details documented in ED Course.    Details: CT head no acute findings.  Risk Risk Details: Patient  given IV fluids x 1 L Antivert  25 mg p.o.  Patient is reevaluated and reports symptoms are much better.  He no longer has dizziness with sitting up.  Patient was counseled on results.  He is advised to follow-up with his primary care physician he is given a prescription for Antivert .  Patient is discharged in stable condition.  Labs ordered reviewed and interpreted.  Potassium is 3.4.  UA is negative troponin is negative x 2      Final diagnoses:  Vertigo    ED Discharge Orders          Ordered    meclizine  (ANTIVERT ) 25 MG tablet  3 times daily PRN        02/29/24 1344          An After Visit Summary was printed and given to the patient.      Flint Sonny POUR, [place] 02/29/24 1523    Dean Clarity, MD 02/29/24 [phone]

## 2024-02-29 NOTE — Discharge Instructions (Addendum)
 Follow up with your Physician for recheck

## 2024-02-29 NOTE — ED Triage Notes (Signed)
 Pt. BIB [facility] from Home with c/o weakness and dizziness when waking up this morning. Pt states that ut started when he was sitting up this morning and got real dizzy, weak, and nasueus. [facility] gave 12 mg of Zofran ; Pt. Has a 18G in L FA, and Pt. Has normal bowel movements and urination; VSS per [facility]   BP: 174/95 HR: 66 NSR CBG: 115 SpO2: 98% RA

## 2024-05-21 ENCOUNTER — Encounter: Payer: Self-pay | Admitting: Internal Medicine
